# Patient Record
Sex: Male | Born: 1990 | Race: White | Hispanic: No | Marital: Married | Smoking: Never smoker
Health system: Southern US, Community
[De-identification: ages and names within clinical notes are randomized; demographics above are authoritative.]

## PROBLEM LIST (undated history)

## (undated) DIAGNOSIS — I10 Essential (primary) hypertension: Secondary | ICD-10-CM

---
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
## 2010-04-12 ENCOUNTER — Inpatient Hospital Stay (INDEPENDENT_AMBULATORY_CARE_PROVIDER_SITE_OTHER)
Admission: RE | Admit: 2010-04-12 | Discharge: 2010-04-12 | Disposition: A | Discharge status: Home / Self Care | Payer: Self-pay | Source: Ambulatory Visit | Attending: Emergency Medicine | Admitting: Emergency Medicine

## 2010-04-12 DIAGNOSIS — N342 Other urethritis: Secondary | ICD-10-CM

## 2010-04-12 LAB — POCT URINALYSIS DIPSTICK
Bilirubin Urine: NEGATIVE
Ketones, ur: NEGATIVE mg/dL
Nitrite: NEGATIVE
Protein, ur: NEGATIVE mg/dL
pH: 7 (ref 5.0–8.0)

## 2010-04-13 LAB — GC/CHLAMYDIA PROBE AMP, GENITAL: GC Probe Amp, Genital: NEGATIVE

## 2010-04-14 LAB — URINE CULTURE

## 2011-07-05 ENCOUNTER — Emergency Department (HOSPITAL_COMMUNITY): Payer: 59

## 2011-07-05 ENCOUNTER — Ambulatory Visit (HOSPITAL_COMMUNITY)
Admission: EM | Admit: 2011-07-05 | Discharge: 2011-07-06 | Disposition: A | Discharge status: Home / Self Care | Payer: 59 | Attending: General Surgery | Admitting: General Surgery

## 2011-07-05 ENCOUNTER — Encounter (HOSPITAL_COMMUNITY): Payer: Self-pay | Admitting: Family Medicine

## 2011-07-05 DIAGNOSIS — R509 Fever, unspecified: Secondary | ICD-10-CM | POA: Insufficient documentation

## 2011-07-05 DIAGNOSIS — K3580 Unspecified acute appendicitis: Secondary | ICD-10-CM

## 2011-07-05 DIAGNOSIS — D72829 Elevated white blood cell count, unspecified: Secondary | ICD-10-CM | POA: Insufficient documentation

## 2011-07-05 DIAGNOSIS — K37 Unspecified appendicitis: Secondary | ICD-10-CM

## 2011-07-05 LAB — URINALYSIS, ROUTINE W REFLEX MICROSCOPIC
Glucose, UA: NEGATIVE mg/dL
Hgb urine dipstick: NEGATIVE
Leukocytes, UA: NEGATIVE
Protein, ur: NEGATIVE mg/dL
Specific Gravity, Urine: 1.014 (ref 1.005–1.030)
pH: 7 (ref 5.0–8.0)

## 2011-07-05 LAB — CBC
Hemoglobin: 16.7 g/dL (ref 13.0–17.0)
Platelets: 297 10*3/uL (ref 150–400)
RBC: 5.55 MIL/uL (ref 4.22–5.81)
WBC: 17.2 10*3/uL — ABNORMAL HIGH (ref 4.0–10.5)

## 2011-07-05 LAB — DIFFERENTIAL
Basophils Relative: 0 % (ref 0–1)
Eosinophils Relative: 0 % (ref 0–5)
Monocytes Relative: 9 % (ref 3–12)
Neutrophils Relative %: 79 % — ABNORMAL HIGH (ref 43–77)

## 2011-07-05 LAB — BASIC METABOLIC PANEL
CO2: 26 mEq/L (ref 19–32)
Chloride: 99 mEq/L (ref 96–112)
Glucose, Bld: 100 mg/dL — ABNORMAL HIGH (ref 70–99)
Potassium: 3.7 mEq/L (ref 3.5–5.1)
Sodium: 137 mEq/L (ref 135–145)

## 2011-07-05 MED ORDER — SODIUM CHLORIDE 0.9 % IV SOLN
INTRAVENOUS | Status: DC
  Administered 2011-07-05: 22:00:00 via INTRAVENOUS

## 2011-07-05 MED ORDER — MORPHINE SULFATE 4 MG/ML IJ SOLN
4.0000 mg | Freq: Once | INTRAMUSCULAR | Status: AC
  Administered 2011-07-05: 4 mg via INTRAVENOUS
  Filled 2011-07-05: qty 1

## 2011-07-05 MED ORDER — IOHEXOL 300 MG/ML  SOLN
80.0000 mL | Freq: Once | INTRAMUSCULAR | Status: AC | PRN
  Administered 2011-07-05: 80 mL via INTRAVENOUS

## 2011-07-05 MED ORDER — ONDANSETRON HCL 4 MG/2ML IJ SOLN
4.0000 mg | Freq: Once | INTRAMUSCULAR | Status: AC
  Administered 2011-07-05: 4 mg via INTRAVENOUS
  Filled 2011-07-05: qty 2

## 2011-07-05 NOTE — ED Notes (Signed)
MD at bedside. 

## 2011-07-05 NOTE — ED Notes (Signed)
Patient sent [facility] for evaluation of possible appendicitis.  Patient reports abdominal pain since 6pm with vomiting x 1 and RLQ pain.

## 2011-07-05 NOTE — ED Provider Notes (Signed)
History     CSN: [id number]  Arrival date & time 07/05/11  2112   First MD Initiated Contact with Patient 07/05/11 2205      Chief Complaint  Patient presents with  . Abdominal Pain    RLQ    (Consider location/radiation/quality/duration/timing/severity/associated sxs/prior treatment) Patient is a 21 y.o. male presenting with abdominal pain. The history is provided by the patient.  Abdominal Pain The primary symptoms of the illness include abdominal pain, nausea and vomiting. The primary symptoms of the illness do not include fever, shortness of breath, diarrhea or dysuria.  Symptoms associated with the illness do not include chills or hematuria.  23 y male c/o rlq pain since around 6 pm.  N/v once. No fever. No anorexia. No diarrhea. No hematoria. No testicle, penis swelling or discharge. No hx of same. Pain increases with standing.  No hx abd surgery.   History reviewed. No pertinent past medical history.  History reviewed. No pertinent past surgical history.  History reviewed. No pertinent family history.  History  Substance Use Topics  . Smoking status: Never Smoker   . Smokeless tobacco: Not on file  . Alcohol Use: No     occassional      Review of Systems  Constitutional: Negative for fever and chills.  Respiratory: Negative for cough and shortness of breath.   Gastrointestinal: Positive for nausea, vomiting and abdominal pain. Negative for diarrhea.  Genitourinary: Negative for dysuria, hematuria, penile swelling, penile pain and testicular pain.  Neurological: Negative for headaches.  Psychiatric/Behavioral: Negative for confusion.  All other systems reviewed and are negative.    Allergies  Ceclor  Home Medications  No current outpatient prescriptions on file.  BP 140/81  Pulse 103  Temp(Src) 99.1 F (37.3 C) (Oral)  Resp 18  Ht 5\' 7"  (1.702 m)  Wt 137 lb (62.143 kg)  BMI 21.46 kg/m2  SpO2 100%  Physical Exam  Vitals reviewed. Constitutional:  He is oriented to person, place, and time. He appears well-developed and well-nourished. No distress.       Holding hand over rlq  HENT:  Head: Normocephalic and atraumatic.  Eyes: Conjunctivae are normal.  Neck: Normal range of motion. Neck supple.  Cardiovascular: Regular rhythm.   No murmur heard.      tachycardia  Pulmonary/Chest: Effort normal. No respiratory distress.  Abdominal: Soft. He exhibits no distension. There is tenderness. There is rebound. There is no guarding.       rlq ttp  Musculoskeletal: Normal range of motion. He exhibits no edema.  Neurological: He is alert and oriented to person, place, and time.  Skin: Skin is warm and dry.  Psychiatric: He has a normal mood and affect. Thought content normal.    ED Course  Procedures (including critical care time) 74 y male with rlq pain and ttp.  Suspect appy.  Will get labs and ct.   Labs Reviewed  CBC - Abnormal; Notable for the following:    WBC 17.2 (*)    All other components within normal limits  DIFFERENTIAL - Abnormal; Notable for the following:    Neutrophils Relative 79 (*)    Neutro Abs 13.6 (*)    Monocytes Absolute 1.5 (*)    All other components within normal limits  BASIC METABOLIC PANEL - Abnormal; Notable for the following:    Glucose, Bld 100 (*)    All other components within normal limits  URINALYSIS, ROUTINE W REFLEX MICROSCOPIC   No results found.   No diagnosis  found.    MDM  rlq pain Appendicitis          Cheri Guppy, MD 07/08/11 1110

## 2011-07-06 ENCOUNTER — Encounter (HOSPITAL_COMMUNITY): Payer: Self-pay | Admitting: Anesthesiology

## 2011-07-06 ENCOUNTER — Encounter (HOSPITAL_COMMUNITY): Payer: Self-pay | Admitting: General Surgery

## 2011-07-06 ENCOUNTER — Encounter (HOSPITAL_COMMUNITY)
Admission: EM | Disposition: A | Discharge status: Home / Self Care | Payer: Self-pay | Source: Home / Self Care | Attending: Emergency Medicine

## 2011-07-06 ENCOUNTER — Emergency Department (HOSPITAL_COMMUNITY): Payer: 59 | Admitting: Anesthesiology

## 2011-07-06 DIAGNOSIS — K3580 Unspecified acute appendicitis: Secondary | ICD-10-CM

## 2011-07-06 HISTORY — PX: LAPAROSCOPIC APPENDECTOMY: SHX408

## 2011-07-06 SURGERY — APPENDECTOMY, LAPAROSCOPIC
Anesthesia: General | Site: Abdomen | Wound class: Contaminated

## 2011-07-06 MED ORDER — CIPROFLOXACIN IN D5W 400 MG/200ML IV SOLN
400.0000 mg | Freq: Once | INTRAVENOUS | Status: AC
  Administered 2011-07-06: 400 mg via INTRAVENOUS
  Filled 2011-07-06: qty 200

## 2011-07-06 MED ORDER — LACTATED RINGERS IV SOLN: INTRAVENOUS | Status: DC

## 2011-07-06 MED ORDER — PANTOPRAZOLE SODIUM 40 MG IV SOLR
40.0000 mg | Freq: Every day | INTRAVENOUS | Status: DC
  Filled 2011-07-06: qty 40

## 2011-07-06 MED ORDER — ACETAMINOPHEN 325 MG PO TABS: 650.0000 mg | ORAL_TABLET | ORAL | Status: AC | PRN

## 2011-07-06 MED ORDER — DEXAMETHASONE SODIUM PHOSPHATE 10 MG/ML IJ SOLN
INTRAMUSCULAR | Status: DC | PRN
  Administered 2011-07-06: 10 mg via INTRAVENOUS

## 2011-07-06 MED ORDER — MORPHINE SULFATE 4 MG/ML IJ SOLN: 4.0000 mg | INTRAMUSCULAR | Status: DC | PRN

## 2011-07-06 MED ORDER — FENTANYL CITRATE 0.05 MG/ML IJ SOLN
INTRAMUSCULAR | Status: DC | PRN
  Administered 2011-07-06: 50 ug via INTRAVENOUS
  Administered 2011-07-06: 100 ug via INTRAVENOUS
  Administered 2011-07-06 (×2): 50 ug via INTRAVENOUS

## 2011-07-06 MED ORDER — CIPROFLOXACIN IN D5W 400 MG/200ML IV SOLN
INTRAVENOUS | Status: DC | PRN
  Administered 2011-07-06: 400 mg via INTRAVENOUS

## 2011-07-06 MED ORDER — ACETAMINOPHEN 325 MG PO TABS: 650.0000 mg | ORAL_TABLET | ORAL | Status: DC | PRN

## 2011-07-06 MED ORDER — ACETAMINOPHEN 10 MG/ML IV SOLN
INTRAVENOUS | Status: DC | PRN
  Administered 2011-07-06: 1000 mg via INTRAVENOUS

## 2011-07-06 MED ORDER — FENTANYL CITRATE 0.05 MG/ML IJ SOLN: 25.0000 ug | INTRAMUSCULAR | Status: DC | PRN

## 2011-07-06 MED ORDER — ONDANSETRON HCL 4 MG PO TABS: 4.0000 mg | ORAL_TABLET | Freq: Four times a day (QID) | ORAL | Status: DC | PRN

## 2011-07-06 MED ORDER — MIDAZOLAM HCL 5 MG/5ML IJ SOLN
INTRAMUSCULAR | Status: DC | PRN
  Administered 2011-07-06: 1 mg via INTRAVENOUS

## 2011-07-06 MED ORDER — NEOSTIGMINE METHYLSULFATE 1 MG/ML IJ SOLN
INTRAMUSCULAR | Status: DC | PRN
  Administered 2011-07-06: 5 mg via INTRAVENOUS

## 2011-07-06 MED ORDER — IBUPROFEN 200 MG PO TABS: ORAL_TABLET | ORAL | Status: AC

## 2011-07-06 MED ORDER — HYDROCODONE-ACETAMINOPHEN 5-325 MG PO TABS
1.0000 | ORAL_TABLET | ORAL | Status: DC | PRN
  Administered 2011-07-06 (×3): 2 via ORAL
  Filled 2011-07-06 (×3): qty 2

## 2011-07-06 MED ORDER — PROPOFOL 10 MG/ML IV EMUL
INTRAVENOUS | Status: DC | PRN
  Administered 2011-07-06: 180 mg via INTRAVENOUS

## 2011-07-06 MED ORDER — ONDANSETRON HCL 4 MG/2ML IJ SOLN: 4.0000 mg | Freq: Four times a day (QID) | INTRAMUSCULAR | Status: DC | PRN

## 2011-07-06 MED ORDER — LACTATED RINGERS IV SOLN
INTRAVENOUS | Status: DC | PRN
  Administered 2011-07-06 (×2): via INTRAVENOUS

## 2011-07-06 MED ORDER — KCL IN DEXTROSE-NACL 20-5-0.9 MEQ/L-%-% IV SOLN
INTRAVENOUS | Status: DC
  Administered 2011-07-06: 75 mL via INTRAVENOUS
  Filled 2011-07-06 (×2): qty 1000

## 2011-07-06 MED ORDER — BUPIVACAINE-EPINEPHRINE 0.25% -1:200000 IJ SOLN
INTRAMUSCULAR | Status: DC | PRN
  Administered 2011-07-06: 20 mL

## 2011-07-06 MED ORDER — IBUPROFEN 600 MG PO TABS
600.0000 mg | ORAL_TABLET | Freq: Four times a day (QID) | ORAL | Status: DC | PRN
  Filled 2011-07-06: qty 1

## 2011-07-06 MED ORDER — BUPIVACAINE-EPINEPHRINE PF 0.25-1:200000 % IJ SOLN
INTRAMUSCULAR | Status: AC
  Filled 2011-07-06: qty 30

## 2011-07-06 MED ORDER — HYDROCODONE-ACETAMINOPHEN 5-325 MG PO TABS: 1.0000 | ORAL_TABLET | ORAL | Status: DC | PRN

## 2011-07-06 MED ORDER — SUCCINYLCHOLINE CHLORIDE 20 MG/ML IJ SOLN
INTRAMUSCULAR | Status: DC | PRN
  Administered 2011-07-06: 100 mg via INTRAVENOUS

## 2011-07-06 MED ORDER — ONDANSETRON HCL 4 MG/2ML IJ SOLN
INTRAMUSCULAR | Status: DC | PRN
  Administered 2011-07-06 (×2): 2 mg via INTRAVENOUS

## 2011-07-06 MED ORDER — LACTATED RINGERS IR SOLN
Status: DC | PRN
  Administered 2011-07-06: 500 mL

## 2011-07-06 MED ORDER — MORPHINE SULFATE 2 MG/ML IJ SOLN: 2.0000 mg | INTRAMUSCULAR | Status: DC | PRN

## 2011-07-06 MED ORDER — MORPHINE SULFATE 4 MG/ML IJ SOLN
4.0000 mg | Freq: Once | INTRAMUSCULAR | Status: AC
  Administered 2011-07-06: 4 mg via INTRAVENOUS
  Filled 2011-07-06: qty 1

## 2011-07-06 MED ORDER — GLYCOPYRROLATE 0.2 MG/ML IJ SOLN
INTRAMUSCULAR | Status: DC | PRN
  Administered 2011-07-06: .8 mg via INTRAVENOUS

## 2011-07-06 MED ORDER — CISATRACURIUM BESYLATE 2 MG/ML IV SOLN
INTRAVENOUS | Status: DC | PRN
  Administered 2011-07-06: 5 mg via INTRAVENOUS

## 2011-07-06 MED ORDER — ACETAMINOPHEN 10 MG/ML IV SOLN
INTRAVENOUS | Status: AC
  Filled 2011-07-06: qty 100

## 2011-07-06 MED ORDER — LIDOCAINE HCL (CARDIAC) 20 MG/ML IV SOLN
INTRAVENOUS | Status: DC | PRN
  Administered 2011-07-06: 75 mg via INTRAVENOUS

## 2011-07-06 MED FILL — Cisatracurium Besylate (PF) IV Soln 10 MG/5ML (2 MG/ML): INTRAVENOUS | Qty: 5 | Status: AC

## 2011-07-06 SURGICAL SUPPLY — 42 items
ADH SKN CLS APL DERMABOND .7 (GAUZE/BANDAGES/DRESSINGS) ×1
APPLIER CLIP ROT 10 11.4 M/L (STAPLE)
APR CLP MED LRG 11.4X10 (STAPLE)
BAG SPEC RTRVL LRG 6X4 10 (ENDOMECHANICALS)
CANISTER SUCTION 2500CC (MISCELLANEOUS) ×2 IMPLANT
CLIP APPLIE ROT 10 11.4 M/L (STAPLE) IMPLANT
CLOTH BEACON ORANGE TIMEOUT ST (SAFETY) ×2 IMPLANT
CUTTER FLEX LINEAR 45M (STAPLE) ×1 IMPLANT
DECANTER SPIKE VIAL GLASS SM (MISCELLANEOUS) ×1 IMPLANT
DERMABOND ADVANCED (GAUZE/BANDAGES/DRESSINGS) ×1
DERMABOND ADVANCED .7 DNX12 (GAUZE/BANDAGES/DRESSINGS) ×1 IMPLANT
DRAPE LAPAROSCOPIC ABDOMINAL (DRAPES) ×2 IMPLANT
DRAPE UTILITY 15X26 (DRAPE) ×1 IMPLANT
ELECT REM PT RETURN 9FT ADLT (ELECTROSURGICAL) ×2
ELECTRODE REM PT RTRN 9FT ADLT (ELECTROSURGICAL) ×1 IMPLANT
ENDOLOOP SUT PDS II  0 18 (SUTURE)
ENDOLOOP SUT PDS II 0 18 (SUTURE) IMPLANT
GLOVE BIO SURGEON STRL SZ7.5 (GLOVE) ×4 IMPLANT
GOWN PREVENTION PLUS XLARGE (GOWN DISPOSABLE) ×2 IMPLANT
GOWN STRL NON-REIN LRG LVL3 (GOWN DISPOSABLE) ×2 IMPLANT
GOWN STRL REIN XL XLG (GOWN DISPOSABLE) ×2 IMPLANT
HAND ACTIVATED (MISCELLANEOUS) ×1 IMPLANT
IV LACTATED RINGERS 1000ML (IV SOLUTION) ×2 IMPLANT
KIT BASIN OR (CUSTOM PROCEDURE TRAY) ×2 IMPLANT
NS IRRIG 1000ML POUR BTL (IV SOLUTION) ×1 IMPLANT
PENCIL BUTTON HOLSTER BLD 10FT (ELECTRODE) ×1 IMPLANT
POUCH SPECIMEN RETRIEVAL 10MM (ENDOMECHANICALS) IMPLANT
RELOAD 45 VASCULAR/THIN (ENDOMECHANICALS) IMPLANT
RELOAD STAPLE 45 2.5 WHT GRN (ENDOMECHANICALS) IMPLANT
RELOAD STAPLE 45 3.5 BLU ETS (ENDOMECHANICALS) IMPLANT
RELOAD STAPLE TA45 3.5 REG BLU (ENDOMECHANICALS) ×2 IMPLANT
SET IRRIG TUBING LAPAROSCOPIC (IRRIGATION / IRRIGATOR) ×2 IMPLANT
SOLUTION ANTI FOG 6CC (MISCELLANEOUS) ×2 IMPLANT
SUT MNCRL AB 4-0 PS2 18 (SUTURE) ×2 IMPLANT
TOWEL OR 17X26 10 PK STRL BLUE (TOWEL DISPOSABLE) ×2 IMPLANT
TRAY FOLEY CATH 14FRSI W/METER (CATHETERS) ×2 IMPLANT
TRAY LAP CHOLE (CUSTOM PROCEDURE TRAY) ×2 IMPLANT
TROCAR BLADELESS OPT 5 75 (ENDOMECHANICALS) ×3 IMPLANT
TROCAR XCEL 12X100 BLDLESS (ENDOMECHANICALS) ×2 IMPLANT
TROCAR XCEL BLUNT TIP 100MML (ENDOMECHANICALS) ×2 IMPLANT
TROCAR XCEL NON-BLD 11X100MML (ENDOMECHANICALS) ×2 IMPLANT
TUBING INSUFFLATION 10FT LAP (TUBING) ×2 IMPLANT

## 2011-07-06 NOTE — Discharge Summary (Signed)
Physician Discharge Summary  Patient ID: Andrew Sellers MRN: [id number] DOB/AGE: 1990-07-03 21 y.o.  Admit date: 07/05/2011 Discharge date: 07/06/2011  Admission Diagnoses: Acute Appendictis  Discharge Diagnoses: Same Active Problems:  Appendicitis, acute   PROCEDURES: Lapaorscopic Appendectomy 07/06/11 DR. [facility] Course: 21 yo wm presents with rlq pain that started about 6pm this evening. Feels like a cramp that won't go away. He has felt a little fevered. Some nausea. CT shows probable early appendicitis He was taken to the OR and finished early AM.  He is doing well and we plan to discharge home after lunch Follow up 2 weeks DR. Toth Condition on D/C:  Improved  Disposition: Final discharge disposition not confirmed   Medication List  As of 07/06/2011  9:26 AM   TAKE these medications         acetaminophen 325 MG tablet   Commonly known as: TYLENOL   Take 2 tablets (650 mg total) by mouth every 4 (four) hours as needed.      HYDROcodone-acetaminophen 5-325 MG per tablet   Commonly known as: NORCO   Take 1-2 tablets by mouth every 4 (four) hours as needed.      ibuprofen 200 MG tablet   Commonly known as: ADVIL,MOTRIN   2-3 tablets every 6 hours for pain as needed.  You can also use plain tylenol.  Save narcotic pain med for pain not relieved by tylenol or ibuprofen.           Follow-up Information    Follow up with Robyne Askew, MD. Schedule an appointment as soon as possible for a visit in 2 weeks.   Contact information:   [employer] [address] [place] [address] [phone]          Signed: Sherrie George 07/06/2011, 9:26 AM

## 2011-07-06 NOTE — Anesthesia Postprocedure Evaluation (Signed)
  Anesthesia Post-op Note  Patient: Andrew Sellers  Procedure(s) Performed: Procedure(s) (LRB): APPENDECTOMY LAPAROSCOPIC (N/A)  Patient Location: PACU  Anesthesia Type: General  Level of Consciousness: awake, alert , oriented and patient cooperative  Airway and Oxygen Therapy: Patient Spontanous Breathing and Patient connected to nasal cannula oxygen  Post-op Pain: none  Post-op Assessment: Post-op Vital signs reviewed, Patient's Cardiovascular Status Stable, Respiratory Function Stable, Patent Airway, No signs of Nausea or vomiting and Pain level controlled  Post-op Vital Signs: Reviewed and stable  Complications: No apparent anesthesia complications

## 2011-07-06 NOTE — Anesthesia Procedure Notes (Signed)
Procedure Name: Intubation Date/Time: 07/06/2011 3:00 AM Performed by: Edison Pace Pre-anesthesia Checklist: Patient identified, Timeout performed, Emergency Drugs available, Suction available and Patient being monitored Patient Re-evaluated:Patient Re-evaluated prior to inductionOxygen Delivery Method: Circle system utilized Preoxygenation: Pre-oxygenation with 100% oxygen Intubation Type: IV induction, Cricoid Pressure applied and Rapid sequence Laryngoscope Size: Mac and 4 Grade View: Grade I Tube type: Oral Tube size: 7.5 mm Number of attempts: 1 Airway Equipment and Method: Stylet Placement Confirmation: ETT inserted through vocal cords under direct vision,  positive ETCO2 and breath sounds checked- equal and bilateral Secured at: 21 cm Tube secured with: Tape Dental Injury: Teeth and Oropharynx as per pre-operative assessment

## 2011-07-06 NOTE — H&P (Signed)
Andrew Sellers is an 21 y.o. male.   Chief Complaint: abd pain HPI: 21 yo wm presents with rlq pain that started about 6pm this evening. Feels like a cramp that won't go away. He has felt a little fevered. Some nausea. CT shows probable early appendicitis  History reviewed. No pertinent past medical history.  History reviewed. No pertinent past surgical history.  History reviewed. No pertinent family history. Social History:  reports that he has never smoked. He does not have any smokeless tobacco history on file. He reports that he does not drink alcohol or use illicit drugs.  Allergies:  Allergies  Allergen Reactions  . Ceclor (Cefaclor) Anaphylaxis     (Not in a hospital admission)  Results for orders placed during the hospital encounter of 07/05/11 (from the past 48 hour(s))  CBC     Status: Abnormal   Collection Time   07/05/11 10:15 PM      Component Value Range Comment   WBC 17.2 (*) 4.0 - 10.5 (K/uL)    RBC 5.55  4.22 - 5.81 (MIL/uL)    Hemoglobin 16.7  13.0 - 17.0 (g/dL)    HCT 16.1  09.6 - 04.5 (%)    MCV 83.8  78.0 - 100.0 (fL)    MCH 30.1  26.0 - 34.0 (pg)    MCHC 35.9  30.0 - 36.0 (g/dL)    RDW 40.9  81.1 - 91.4 (%)    Platelets 297  150 - 400 (K/uL)   DIFFERENTIAL     Status: Abnormal   Collection Time   07/05/11 10:15 PM      Component Value Range Comment   Neutrophils Relative 79 (*) 43 - 77 (%)    Lymphocytes Relative 12  12 - 46 (%)    Monocytes Relative 9  3 - 12 (%)    Eosinophils Relative 0  0 - 5 (%)    Basophils Relative 0  0 - 1 (%)    Neutro Abs 13.6 (*) 1.7 - 7.7 (K/uL)    Lymphs Abs 2.1  0.7 - 4.0 (K/uL)    Monocytes Absolute 1.5 (*) 0.1 - 1.0 (K/uL)    Eosinophils Absolute 0.0  0.0 - 0.7 (K/uL)    Basophils Absolute 0.0  0.0 - 0.1 (K/uL)    WBC Morphology TOXIC GRANULATION     BASIC METABOLIC PANEL     Status: Abnormal   Collection Time   07/05/11 10:15 PM      Component Value Range Comment   Sodium 137  135 - 145 (mEq/L)    Potassium 3.7  3.5  - 5.1 (mEq/L)    Chloride 99  96 - 112 (mEq/L)    CO2 26  19 - 32 (mEq/L)    Glucose, Bld 100 (*) 70 - 99 (mg/dL)    BUN 12  6 - 23 (mg/dL)    Creatinine, Ser 7.82  0.50 - 1.35 (mg/dL)    Calcium 9.5  8.4 - 10.5 (mg/dL)    GFR calc non Af Amer >90  >90 (mL/min)    GFR calc Af Amer >90  >90 (mL/min)   URINALYSIS, ROUTINE W REFLEX MICROSCOPIC     Status: Normal   Collection Time   07/05/11 10:42 PM      Component Value Range Comment   Color, Urine YELLOW  YELLOW     APPearance CLEAR  CLEAR     Specific Gravity, Urine 1.014  1.005 - 1.030     pH 7.0  5.0 -  8.0     Glucose, UA NEGATIVE  NEGATIVE (mg/dL)    Hgb urine dipstick NEGATIVE  NEGATIVE     Bilirubin Urine NEGATIVE  NEGATIVE     Ketones, ur NEGATIVE  NEGATIVE (mg/dL)    Protein, ur NEGATIVE  NEGATIVE (mg/dL)    Urobilinogen, UA 0.2  0.0 - 1.0 (mg/dL)    Nitrite NEGATIVE  NEGATIVE     Leukocytes, UA NEGATIVE  NEGATIVE  MICROSCOPIC NOT DONE ON URINES WITH NEGATIVE PROTEIN, BLOOD, LEUKOCYTES, NITRITE, OR GLUCOSE <1000 mg/dL.   Ct Abdomen Pelvis W Contrast  07/06/2011  *RADIOLOGY REPORT*  Clinical Data: Right lower quadrant abdominal pain and vomiting.  CT ABDOMEN AND PELVIS WITH CONTRAST  Technique:  Multidetector CT imaging of the abdomen and pelvis was performed following the standard protocol during bolus administration of intravenous contrast.  Contrast: 80mL OMNIPAQUE IOHEXOL 300 MG/ML  SOLN  Comparison: None.  Findings: The visualized lung bases are clear.  The liver and spleen are unremarkable in appearance.  The gallbladder is within normal limits.  The pancreas and adrenal glands are unremarkable.  The kidneys are unremarkable in appearance.  There is no evidence of hydronephrosis.  No renal or ureteral stones are seen.  No perinephric stranding is appreciated.  The small bowel is unremarkable in appearance.  The stomach is within normal limits.  No acute vascular abnormalities are seen.  The appendix is dilated up to 9 mm in  maximal diameter on coronal images, with mild wall thickening and enhancement, and an appendicolith noted along the distal appendix.  Much of the appendix contains fluid, though there are scattered foci of air in the appendix.  There is suggestion of minimal associated soft tissue stranding.  The appearance most likely reflects mild appendicitis, particularly given the patient's symptoms and leukocytosis.  Trace Hunsucker fluid is noted within the pelvis.  There is no evidence for perforation or abscess formation at this time.  The bladder is mildly distended and grossly unremarkable appearance.  The prostate remains normal in size.  No inguinal lymphadenopathy is seen.  No acute osseous abnormalities are identified.  IMPRESSION: Suspect mild acute appendicitis, with dilatation of the appendix to 9 mm in maximal diameter, and mild wall thickening enhancement. Appendicolith noted along the distal appendix.  Trace Canaday fluid within the pelvis.  No evidence for perforation or abscess formation at this time.  These results were called by telephone on 07/05/2011  at  12:38 a.m. to  Dr. Maryanna Shape, who verbally acknowledged these results.  Original Report Authenticated By: Tonia Ghent, M.D.    Review of Systems  Constitutional: Positive for fever.  HENT: Negative.   Eyes: Negative.   Respiratory: Negative.   Cardiovascular: Negative.   Gastrointestinal: Positive for abdominal pain.  Genitourinary: Negative.   Musculoskeletal: Negative.   Skin: Negative.   Neurological: Negative.   Endo/Heme/Allergies: Negative.   Psychiatric/Behavioral: Negative.     Blood pressure 140/81, pulse 103, temperature 99.1 F (37.3 C), temperature source Oral, resp. rate 18, height 5\' 7"  (1.702 m), weight 137 lb (62.143 kg), SpO2 100.00%. Physical Exam  Constitutional: He is oriented to person, place, and time. He appears well-developed and well-nourished.  HENT:  Head: Normocephalic and atraumatic.  Eyes: Conjunctivae  and EOM are normal. Pupils are equal, round, and reactive to light.  Neck: Normal range of motion. Neck supple.  Cardiovascular: Normal rate, regular rhythm and normal heart sounds.   Respiratory: Effort normal and breath sounds normal.  GI: Soft. Bowel sounds are  normal.       Focal rlq pain. No guarding or peritonitis  Musculoskeletal: Normal range of motion.  Neurological: He is alert and oriented to person, place, and time.  Skin: Skin is warm and dry.  Psychiatric: He has a normal mood and affect. His behavior is normal.     Assessment/Plan Probable appendicitis. Because of the risk of perforation and sepsis i think he would benefit from having appendix removed. I have discussed with him the risks and benefits of surgery including some of the technical aspects including the risk of leak and he understands and wishes to proceed.  TOTH III,Dagen Beevers S 07/06/2011, 1:54 AM

## 2011-07-06 NOTE — Op Note (Signed)
07/05/2011 - 07/06/2011  3:48 AM  PATIENT:  Andrew Sellers  21 y.o. male  PRE-OPERATIVE DIAGNOSIS:  acute appendicitis  POST-OPERATIVE DIAGNOSIS:  acute appendicitis  PROCEDURE:  Procedure(s) (LRB): APPENDECTOMY LAPAROSCOPIC (N/A)  SURGEON:  Surgeon(s) and Role:    * Robyne Askew, MD - Primary  PHYSICIAN ASSISTANT:   ASSISTANTS: none   ANESTHESIA:   general  EBL:  Total I/O In: -  Out: 75 [Urine:75]  BLOOD ADMINISTERED:none  DRAINS: none   LOCAL MEDICATIONS USED:  MARCAINE     SPECIMEN:  Source of Specimen:  appendix  DISPOSITION OF SPECIMEN:  PATHOLOGY  COUNTS:  YES  TOURNIQUET:  * No tourniquets in log *  DICTATION: .Dragon Dictation After informed consent was obtained the patient was brought to the operating room and placed in the supine position on the operating room table. After adequate induction of general anesthesia the patient's abdomen was prepped with ChloraPrep, allowed to dry, and draped in the usual sterile manner. The area below the umbilicus was infiltrated with quarter percent Marcaine. A small incision was made with a 15 blade knife. This incision was carried through the subcutaneous tissue bluntly with a hemostat and [employer] retractors until the linea alba was identified. The linea alba was incised with a 15 blade knife. Each side was grasped with Coker clamps and elevated anteriorly. The preperitoneal space was then probed bluntly with a hemostat until the peritoneum was opened and access was gained to the abdominal cavity. A 0 Vicryl purse string stitch was placed in the fascia surrounding the opening. A Hassan cannula was then placed through the opening and anchored in place with the previously placed Vicryl pursestring stitch. The abdomen was then insufflated with carbon dioxide without difficulty. The patient was placed in Trendelenburg position and rotated with the right side up. Next the suprapubic area was infiltrated with quarter percent Marcaine.  A small stab incision was made with 15 blade knife. A 5 mm port was placed bluntly through this incision into the abdominal cavity under direct vision. A site was then chosen between the 2 ports for placement of another 5 mm port. This area was infiltrated with quarter percent Marcaine. A small stab incision was made with a 15 blade knife. A 5mm port was then placed bluntly through this incision into the abdominal cavity under direct vision. A 5 mm 30 scope was then placed through the suprapubic port. The abdomen was inspected and an enlarged inflamed appendix was identified in the right lower quadrant. A Glassman grasper was then used to elevate the appendix. The mesoappendix was then taken down sharply with the harmonic scalpel. Once the mesoappendix was taken down the base of the appendix where it joined the cecum was identified and cleared of any tissue. Next a laparoscopic blue load 6 row 45 mm stapler was placed at the aside cannula and across the base of the appendix where it joined the cecum, clamped, and fired thereby dividing the base of the appendix between staple lines. A laparoscopic bag was inserted through the [facility] cannula. The appendix was placed within the bag and the bag was sealed. The staple line was inspected and found to be completely hemostatic. The abdomen was irrigated with copious amounts of saline. The appendix and bag were then removed with the [facility] cannula through the infraumbilical port. The fascial defect was closed with the previously placed Vicryl pursestring stitch as well as with another figure-of-eight 0 Vicryl stitch. The gas was allowed to escape  and the 5 mm ports were removed. The port sites were irrigated with copious amounts of saline. The skin incisions were closed with interrupted 4 Marko subcuticular stitches. Dermabond dressings were applied. The patient tolerated the procedure well. At the end of the case all needle sponge and instrument counts were correct. The  patient was then awakened and taken to recovery in stable condition.  PLAN OF CARE: Admit for overnight observation  PATIENT DISPOSITION:  PACU - hemodynamically stable.   Delay start of Pharmacological VTE agent (>24hrs) due to surgical blood loss or risk of bleeding: not applicable

## 2011-07-06 NOTE — Transfer of Care (Signed)
Immediate Anesthesia Transfer of Care Note  Patient: Andrew Sellers  Procedure(s) Performed: Procedure(s) (LRB): APPENDECTOMY LAPAROSCOPIC (N/A)  Patient Location: PACU  Anesthesia Type: General  Level of Consciousness: awake, alert , oriented and patient cooperative  Airway & Oxygen Therapy: Patient Spontanous Breathing and Patient connected to face mask oxygen  Post-op Assessment: Report given to PACU RN, Post -op Vital signs reviewed and stable and Patient moving all extremities  Post vital signs: Reviewed and stable  Complications: No apparent anesthesia complications

## 2011-07-06 NOTE — ED Notes (Signed)
MD at bedside. 

## 2011-07-06 NOTE — Anesthesia Postprocedure Evaluation (Signed)
  Anesthesia Post-op Note  Patient: Andrew Sellers  Procedure(s) Performed: Procedure(s) (LRB): APPENDECTOMY LAPAROSCOPIC (N/A)  Patient Location: PACU  Anesthesia Type: General  Level of Consciousness: awake, alert , oriented and patient cooperative  Airway and Oxygen Therapy: Patient Spontanous Breathing and Patient connected to nasal cannula oxygen  Post-op Pain: none  Post-op Assessment: Post-op Vital signs reviewed, Patient's Cardiovascular Status Stable, Respiratory Function Stable, Patent Airway, No signs of Nausea or vomiting and Pain level controlled  Post-op Vital Signs: Reviewed and stable  Complications: No apparent anesthesia complications 

## 2011-07-06 NOTE — Progress Notes (Signed)
Day of Surgery  Subjective: TM99.1,  VSS, He's eating regular diet, he has walked some.  Hesitant to leave right now.  Still having some pain at trochar site.  Objective: Vital signs in last 24 hours: Temp:  [98 F (36.7 C)-99.1 F (37.3 C)] 98 F (36.7 C) (05/10 0651) Pulse Rate:  [92-115] 92  (05/10 0651) Resp:  [10-18] 18  (05/10 0651) BP: (120-149)/(71-85) 127/80 mmHg (05/10 0651) SpO2:  [98 %-100 %] 98 % (05/10 0651) Weight:  [62.143 kg (137 lb)] 62.143 kg (137 lb) (05/09 2143) Last BM Date: 07/04/11    Intake/Output from previous day: 05/09 0701 - 05/10 0700 In: 1700 [I.V.:1700] Out: 475 [Urine:475] Intake/Output this shift:    General appearance: no distress GI: soft, tender, +BS no distension  Lab Results:   Basename 07/05/11 2215  WBC 17.2*  HGB 16.7  HCT 46.5  PLT 297    BMET  Basename 07/05/11 2215  NA 137  K 3.7  CL 99  CO2 26  GLUCOSE 100*  BUN 12  CREATININE 1.09  CALCIUM 9.5   PT/INR No results found for this basename: LABPROT:2,INR:2 in the last 72 hours  No results found for this basename: AST:5,ALT:5,ALKPHOS:5,BILITOT:5,PROT:5,ALBUMIN:5 in the last 168 hours   Lipase  No results found for this basename: lipase     Studies/Results: Ct Abdomen Pelvis W Contrast  07/06/2011  *RADIOLOGY REPORT*  Clinical Data: Right lower quadrant abdominal pain and vomiting.  CT ABDOMEN AND PELVIS WITH CONTRAST  Technique:  Multidetector CT imaging of the abdomen and pelvis was performed following the standard protocol during bolus administration of intravenous contrast.  Contrast: 80mL OMNIPAQUE IOHEXOL 300 MG/ML  SOLN  Comparison: None.  Findings: The visualized lung bases are clear.  The liver and spleen are unremarkable in appearance.  The gallbladder is within normal limits.  The pancreas and adrenal glands are unremarkable.  The kidneys are unremarkable in appearance.  There is no evidence of hydronephrosis.  No renal or ureteral stones are seen.  No  perinephric stranding is appreciated.  The small bowel is unremarkable in appearance.  The stomach is within normal limits.  No acute vascular abnormalities are seen.  The appendix is dilated up to 9 mm in maximal diameter on coronal images, with mild wall thickening and enhancement, and an appendicolith noted along the distal appendix.  Much of the appendix contains fluid, though there are scattered foci of air in the appendix.  There is suggestion of minimal associated soft tissue stranding.  The appearance most likely reflects mild appendicitis, particularly given the patient's symptoms and leukocytosis.  Trace Czaja fluid is noted within the pelvis.  There is no evidence for perforation or abscess formation at this time.  The bladder is mildly distended and grossly unremarkable appearance.  The prostate remains normal in size.  No inguinal lymphadenopathy is seen.  No acute osseous abnormalities are identified.  IMPRESSION: Suspect mild acute appendicitis, with dilatation of the appendix to 9 mm in maximal diameter, and mild wall thickening enhancement. Appendicolith noted along the distal appendix.  Trace Creech fluid within the pelvis.  No evidence for perforation or abscess formation at this time.  These results were called by telephone on 07/05/2011  at  12:38 a.m. to  Dr. Maryanna Shape, who verbally acknowledged these results.  Original Report Authenticated By: Tonia Ghent, M.D.    Medications:    . ciprofloxacin  400 mg Intravenous Once  .  morphine injection  4 mg Intravenous Once  .  morphine injection  4 mg Intravenous Once  . ondansetron  4 mg Intravenous Once  . pantoprazole (PROTONIX) IV  40 mg Intravenous QHS    Assessment/Plan 1. acute appendicitis   Plan; Home later today.        LOS: 1 day    Andrew Sellers 07/06/2011

## 2011-07-06 NOTE — Anesthesia Preprocedure Evaluation (Addendum)
Anesthesia Evaluation  Patient identified by MRN, date of birth, ID band Patient awake    Reviewed: Allergy & Precautions, H&P , NPO status , Patient's Chart, lab work & pertinent test results  Airway Mallampati: II TM Distance: >3 FB Neck ROM: full    Dental No notable dental hx. (+) Teeth Intact and Dental Advisory Given   Pulmonary neg pulmonary ROS,  breath sounds clear to auscultation  Pulmonary exam normal       Cardiovascular Exercise Tolerance: Good negative cardio ROS  Rhythm:regular Rate:Normal     Neuro/Psych negative neurological ROS  negative psych ROS   GI/Hepatic negative GI ROS, Neg liver ROS,   Endo/Other  negative endocrine ROS  Renal/GU negative Renal ROS  negative genitourinary   Musculoskeletal   Abdominal   Peds  Hematology negative hematology ROS (+)   Anesthesia Other Findings   Reproductive/Obstetrics negative OB ROS                           Anesthesia Physical Anesthesia Plan  ASA: I and Emergent  Anesthesia Plan: General   Post-op Pain Management:    Induction: Intravenous  Airway Management Planned: Oral ETT  Additional Equipment:   Intra-op Plan:   Post-operative Plan: Extubation in OR  Informed Consent: I have reviewed the patients History and Physical, chart, labs and discussed the procedure including the risks, benefits and alternatives for the proposed anesthesia with the patient or authorized representative who has indicated his/her understanding and acceptance.   Dental Advisory Given  Plan Discussed with: CRNA and Surgeon  Anesthesia Plan Comments:         Anesthesia Quick Evaluation  

## 2011-07-06 NOTE — Preoperative (Signed)
Beta Blockers   Reason not to administer Beta Blockers:Not Applicable 

## 2011-07-06 NOTE — ED Provider Notes (Signed)
  Physical Exam  BP 127/80  Pulse 92  Temp(Src) 98 F (36.7 C) (Oral)  Resp 18  Ht 5\' 7"  (1.702 m)  Wt 137 lb (62.143 kg)  BMI 21.46 kg/m2  SpO2 98%  Physical Exam  ED Course  Procedures  MDM Patient has appendicitis by CT. He'll be admitted by surgery   Juliet Rude. Rubin Payor, MD 07/06/11 [phone]

## 2011-07-06 NOTE — Discharge Instructions (Signed)
Laparoscopic Appendectomy Appendectomy is surgery to remove the appendix. Laparoscopic surgery uses several small cuts (incisions) instead of one large incision. Laparoscopic surgery offers a shorter recovery time and less discomfort. LET YOUR CAREGIVER KNOW ABOUT:  Allergies to food or medicine.   Medicines taken, including vitamins, dietary supplements, herbs, eyedrops, over-the-counter medicines, and creams.   Use of steroids (by mouth or creams).   Previous problems with anesthetics or numbing medicines.   History of bleeding problems or blood clots.   Previous surgery.   Other health problems, including diabetes, heart problems, lung problems, and kidney problems.   Possibility of pregnancy, if this applies.  RISKS AND COMPLICATIONS  Infection. A germ starts growing in the wound. This can usually be treated with antibiotics. In some cases, the wound will need to be opened and cleaned.   Bleeding.   Damage to other organs.   Sores (abscesses).   Chronic pain at the incision sites. This is defined as pain that lasts for more than 3 months.   Blood clots in the legs that may rarely travel to the lungs.   Infection in the lungs (pneumonia).  BEFORE THE PROCEDURE Appendectomy is usually performed immediately after an inflamed appendix (appendicitis) is diagnosed. No preparation is necessary ahead of this procedure. PROCEDURE  You will be given medicine that makes you sleep (general anesthetic). After you are asleep, a flexible tube (catheter) may be inserted into your bladder to drain your urine during surgery. The tube is removed before you wake up after surgery. When you are asleep, carbondioxide gas will be used to inflate your abdomen. This will allow your surgeon to see inside your abdomen and perform your surgery. Three small incisions will be made in your abdomen. Your surgeon will insert a thin, lighted tube (laparoscope) through one of the incisions. Your surgeon will  look through the laparoscope while performing the surgery. Other tools will be inserted through the other incisions. Laparoscopic procedures may not be appropriate when:  There is major scarring from a previous surgery.   The patient has bleeding disorders.   A pregnancy is near term.   There are other conditions which make the laparoscopic procedure impossible, such as an advanced infection or a ruptured appendix.  If your surgeon feels it is not safe to continue with the laparoscopic procedure, he or she will perform an open surgery instead. This gives the surgeon a larger view and more space to work. Open surgery requires a longer recovery time. After your appendix is removed, your incisions will be closed with stitches (sutures) or skin adhesive. AFTER THE PROCEDURE You will be taken to a recovery room. When the anesthesia has worn off, you will be returned to your hospital room. You will be given pain medicines to keep you comfortable. Ask your caregiver how long your hospital stay will be. Document Released: 09/27/2003 Document Revised: 02/01/2011 Document Reviewed: 08/22/2010 ExitCare Patient Information 2012 [employer]CCS ______[facility] LAPAROSCOPIC SURGERY: POST OP INSTRUCTIONS Always review your discharge instruction sheet given to you by the facility where your surgery was performed. IF YOU HAVE DISABILITY OR FAMILY LEAVE FORMS, YOU MUST BRING THEM TO THE OFFICE FOR PROCESSING.   DO NOT GIVE THEM TO YOUR DOCTOR.  1. A prescription for pain medication may be given to you upon discharge.  Take your pain medication as prescribed, if needed.  If narcotic pain medicine is not needed, then you may take acetaminophen (Tylenol) or ibuprofen (Advil) as needed. 2. Take your   usually prescribed medications unless otherwise directed. 3. If you need a refill on your pain medication, please contact your pharmacy.  They will contact our office to request authorization.  Prescriptions will not be filled after 5pm or on week-ends. 4. You should follow a light diet the first few days after arrival home, such as soup and crackers, etc.  Be sure to include lots of fluids daily. 5. Most patients will experience some swelling and bruising in the area of the incisions.  Ice packs will help.  Swelling and bruising can take several days to resolve.  6. It is common to experience some constipation if taking pain medication after surgery.  Increasing fluid intake and taking a stool softener (such as Colace) will usually help or prevent this problem from occurring.  A mild laxative (Milk of Magnesia or Miralax) should be taken according to package instructions if there are no bowel movements after 48 hours. 7. Unless discharge instructions indicate otherwise, you may remove your bandages 24-48 hours after surgery, and you may shower at that time.  You may have steri-strips (small skin tapes) in place directly over the incision.  These strips should be left on the skin for 7-10 days.  If your surgeon used skin glue on the incision, you may shower in 24 hours.  The glue will flake off over the next 2-3 weeks.  Any sutures or staples will be removed at the office during your follow-up visit. 8. ACTIVITIES:  You may resume regular (light) daily activities beginning the next day--such as daily self-care, walking, climbing stairs--gradually increasing activities as tolerated.  You may have sexual intercourse when it is comfortable.  Refrain from any heavy lifting or straining until approved by your doctor. a. You may drive when you are no longer taking prescription pain medication, you can comfortably wear a seatbelt, and you can safely maneuver your car and apply brakes. b. RETURN TO WORK:  __________________________________________________________ 9. You should see your doctor in the office for a follow-up appointment approximately 2-3 weeks after your surgery.  Make sure that you call for  this appointment within a day or two after you arrive home to insure a convenient appointment time. 10. OTHER INSTRUCTIONS: __________________________________________________________________________________________________________________________ __________________________________________________________________________________________________________________________ WHEN TO CALL YOUR DOCTOR: 1. Fever over 101.0 2. Inability to urinate 3. Continued bleeding from incision. 4. Increased pain, redness, or drainage from the incision. 5. Increasing abdominal pain  The clinic staff is available to answer your questions during regular business hours.  Please don't hesitate to call and ask to speak to one of the nurses for clinical concerns.  If you have a medical emergency, go to the nearest emergency room or call 911.  A surgeon from [facility] is always on call at the hospital. [address], [address], [place], [place]  [address] ? [address] [address], [place], [place]   [address] [phone] ? [phone] ? FAX [phone] Web site: [email or web address]  

## 2011-07-08 NOTE — Anesthesia Postprocedure Evaluation (Signed)
  Anesthesia Post-op Note  Patient: Andrew Sellers  Procedure(s) Performed: Procedure(s) (LRB): APPENDECTOMY LAPAROSCOPIC (N/A)  Patient Location: PACU  Anesthesia Type: General  Level of Consciousness: awake and alert   Airway and Oxygen Therapy: Patient Spontanous Breathing  Post-op Pain: mild  Post-op Assessment: Post-op Vital signs reviewed, Patient's Cardiovascular Status Stable, Respiratory Function Stable, Patent Airway and No signs of Nausea or vomiting  Post-op Vital Signs: stable  Complications: No apparent anesthesia complications

## 2011-07-09 ENCOUNTER — Encounter (HOSPITAL_COMMUNITY): Payer: Self-pay | Admitting: General Surgery

## 2011-07-12 ENCOUNTER — Encounter (INDEPENDENT_AMBULATORY_CARE_PROVIDER_SITE_OTHER): Payer: Self-pay | Admitting: General Surgery

## 2011-07-12 ENCOUNTER — Ambulatory Visit (INDEPENDENT_AMBULATORY_CARE_PROVIDER_SITE_OTHER): Payer: 59 | Admitting: General Surgery

## 2011-07-12 VITALS — BP 148/90 | HR 84 | Temp 100.4°F | Resp 12 | Ht 66.0 in | Wt 135.0 lb

## 2011-07-12 DIAGNOSIS — K3580 Unspecified acute appendicitis: Secondary | ICD-10-CM

## 2011-07-12 NOTE — Patient Instructions (Signed)
No heavy lifting for another 5 weeks

## 2011-07-13 ENCOUNTER — Encounter (INDEPENDENT_AMBULATORY_CARE_PROVIDER_SITE_OTHER): Payer: Self-pay | Admitting: General Surgery

## 2011-07-13 NOTE — Progress Notes (Signed)
Subjective:     Patient ID: Andrew Sellers, male   DOB: 1990/08/28, 21 y.o.   MRN: [id number]  HPI The pt is a 21 yo wm who is about a week out from a laparoscopic appendectomy for appendicitis. He has done very well since surgery. He has no complaints. His appetite is good and his bowels are working normally.  Review of Systems     Objective:   Physical Exam On exam his abd is soft and nontender. His incisions are healing nicely with no sign of infection.    Assessment:     S/P lap appy    Plan:     At this point he is doing great. I would like him to refrain from strenuous activity for the full 6 weeks from the time of surgery. We will plan on seeing him back on a prn basis

## 2013-05-01 IMAGING — CT CT ABD-PELV W/ CM
1 of 2 series · 15 of 32 positions shown, 19 images · IV contrast (APPLIED)
Comparison: None.

CLINICAL DATA: Right lower quadrant abdominal pain and vomiting.

CT ABDOMEN AND PELVIS WITH CONTRAST
TECHNIQUE: Multidetector CT imaging of the abdomen and pelvis was
performed following the standard protocol during bolus
administration of intravenous contrast.
Contrast: 80mL OMNIPAQUE IOHEXOL 300 MG/ML  SOLN

[Series 2: abd/pel with · axial · 0.74mm/px · z∈[+1086,+1461]mm · 15 of 83 slices shown, 19 images]
[im 4/83  soft-tissue]
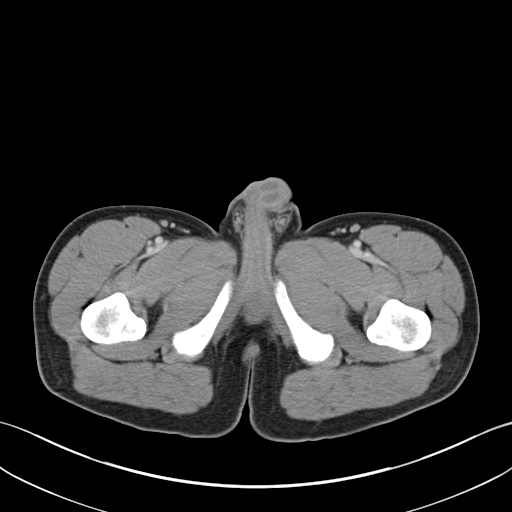
[im 4/83  bone]
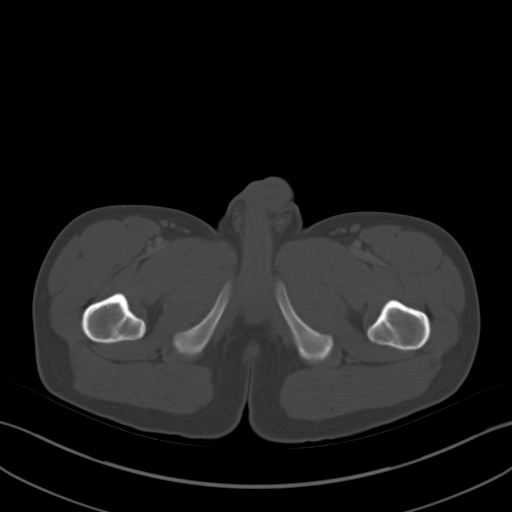
[im 11/83  soft-tissue]
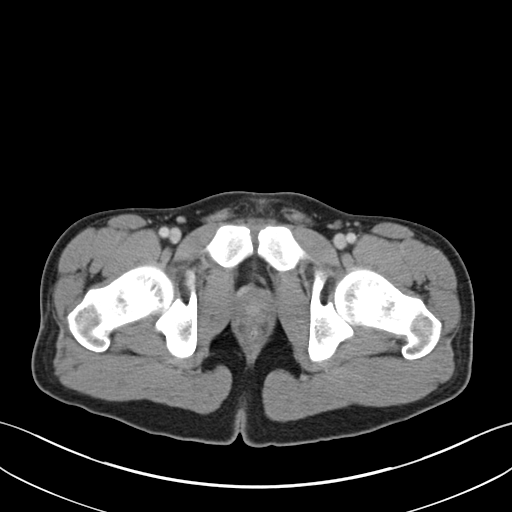
[im 18/83  soft-tissue]
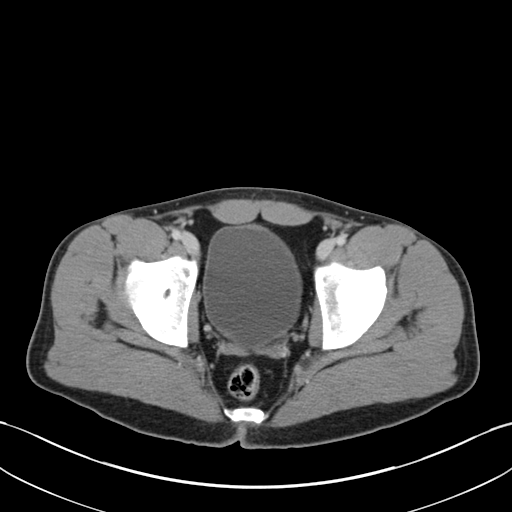
[im 24/83  soft-tissue]
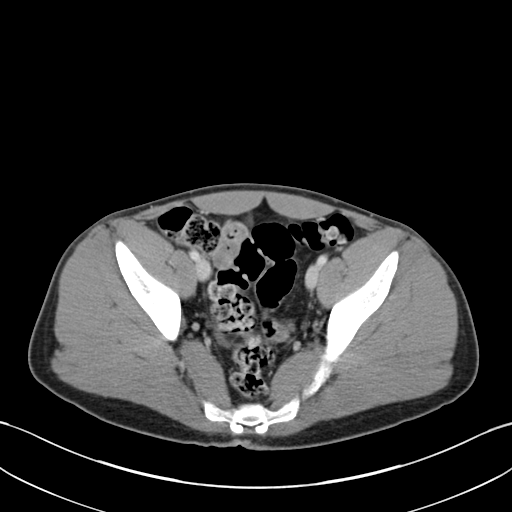
[im 28/83  soft-tissue]
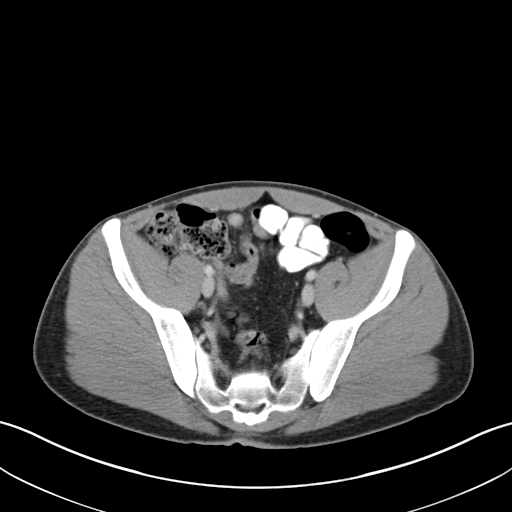
[im 35/83  soft-tissue]
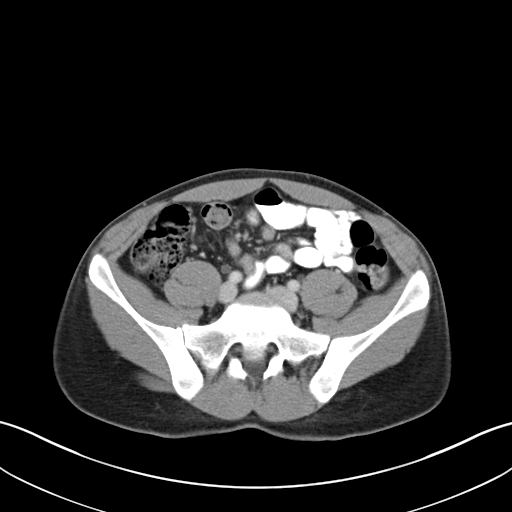
[im 42/83  soft-tissue]
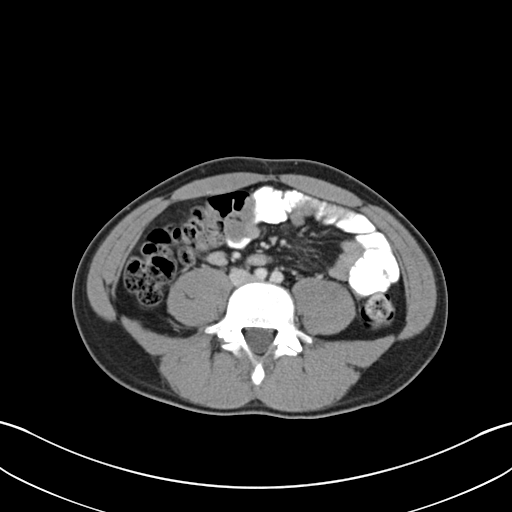
[im 48/83  soft-tissue]
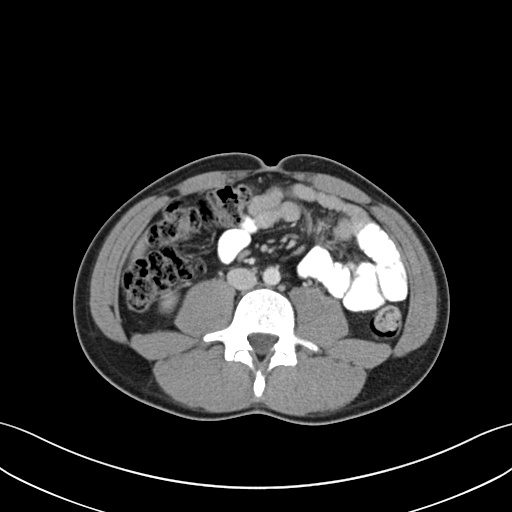
[im 55/83  soft-tissue]
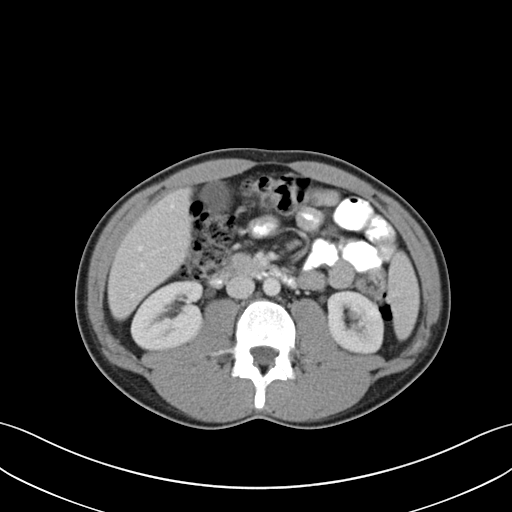
[im 55/83  bone]
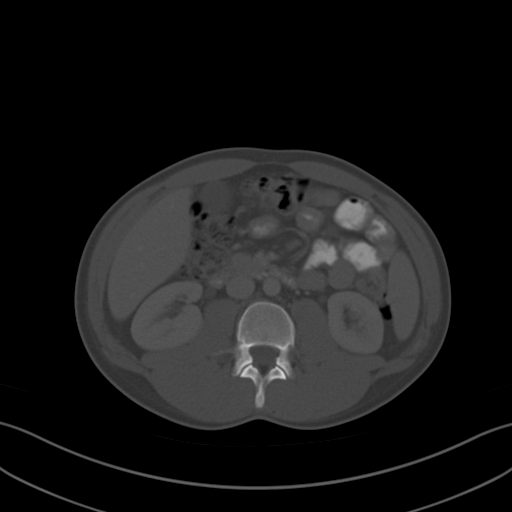
[im 59/83  soft-tissue]
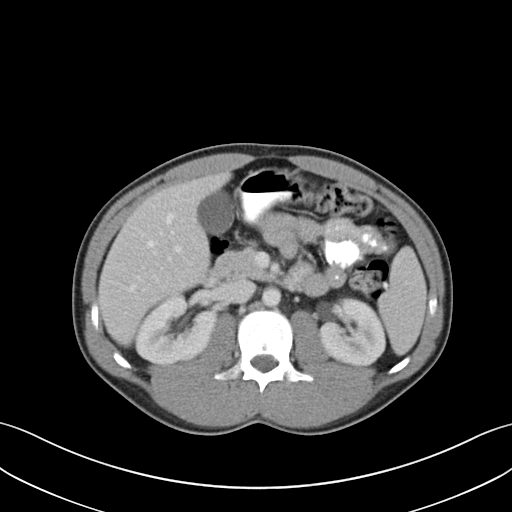
[im 65/83  soft-tissue]
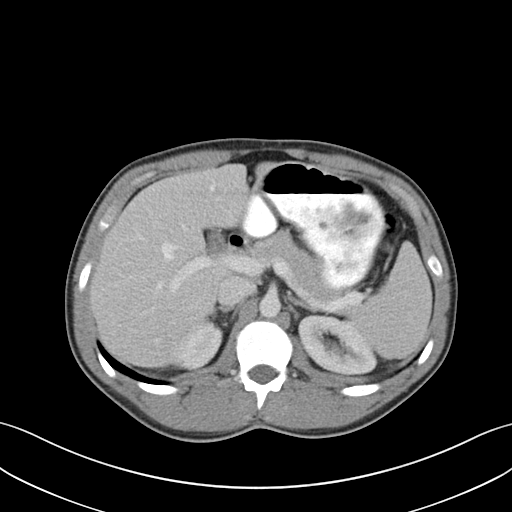
[im 69/83  lung]
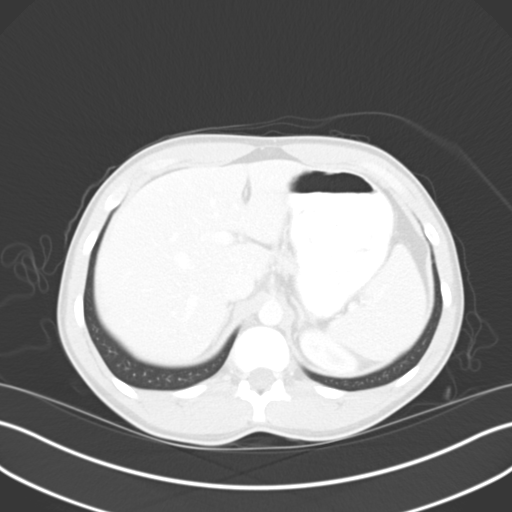
[im 72/83  soft-tissue]
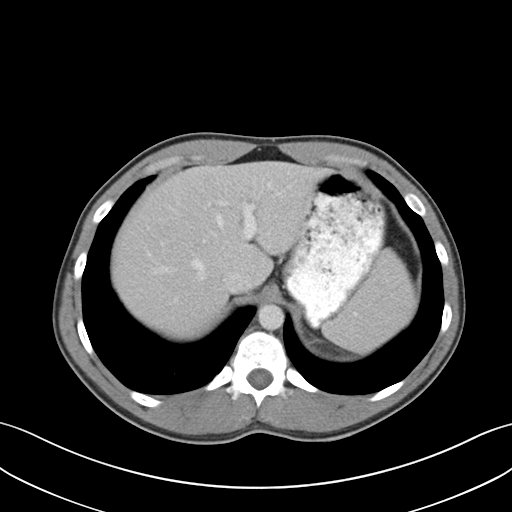
[im 72/83  lung]
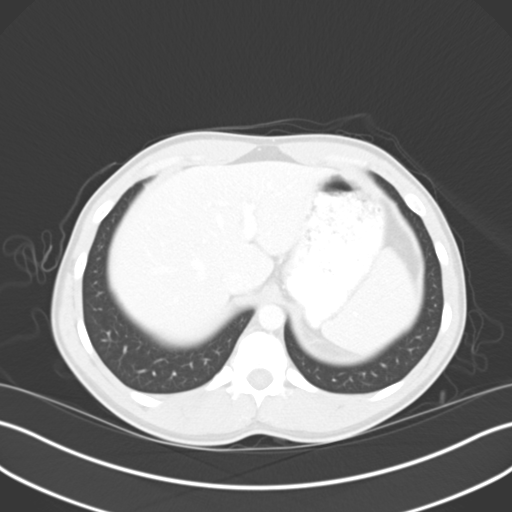
[im 76/83  lung]
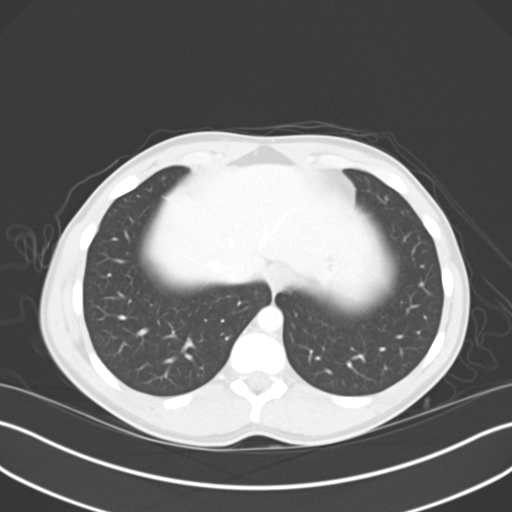
[im 79/83  soft-tissue]
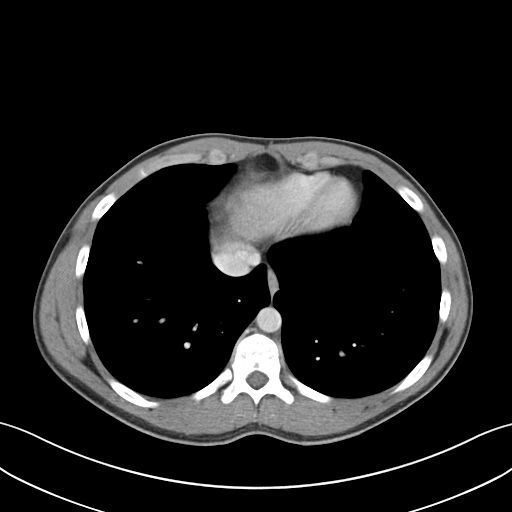
[im 79/83  lung]
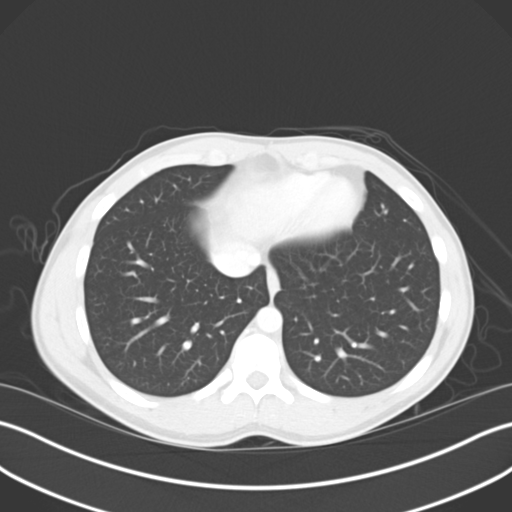

[15 of 32 positions shown; findings below may reference images not displayed]

FINDINGS: The visualized lung bases are clear.

The liver and spleen are unremarkable in appearance.  The
gallbladder is within normal limits.  The pancreas and adrenal
glands are unremarkable.

The kidneys are unremarkable in appearance.  There is no evidence
of hydronephrosis.  No renal or ureteral stones are seen.  No
perinephric stranding is appreciated.

The small bowel is unremarkable in appearance.  The stomach is
within normal limits.  No acute vascular abnormalities are seen.

The appendix is dilated up to 9 mm in maximal diameter on coronal
images, with mild wall thickening and enhancement, and an
appendicolith noted along the distal appendix.  Much of the
appendix contains fluid, though there are scattered foci of air in
the appendix.  There is suggestion of minimal associated soft
tissue stranding.  The appearance most likely reflects mild
appendicitis, particularly given the patient's symptoms and
leukocytosis.

Trace free fluid is noted within the pelvis.  There is no evidence
for perforation or abscess formation at this time.

The bladder is mildly distended and grossly unremarkable
appearance.  The prostate remains normal in size.  No inguinal
lymphadenopathy is seen.

No acute osseous abnormalities are identified.
IMPRESSION: Suspect mild acute appendicitis, with dilatation of the appendix to
9 mm in maximal diameter, and mild wall thickening enhancement.
Appendicolith noted along the distal appendix.  Trace free fluid
within the pelvis.  No evidence for perforation or abscess
formation at this time.

These results were called by telephone on 07/05/2011  at  [DATE]
a.m. to  Dr. She Refs Shafec, who verbally acknowledged these
results.

## 2016-10-27 ENCOUNTER — Emergency Department (HOSPITAL_BASED_OUTPATIENT_CLINIC_OR_DEPARTMENT_OTHER)
Admission: EM | Admit: 2016-10-27 | Discharge: 2016-10-27 | Disposition: A | Discharge status: Home / Self Care | Payer: Managed Care, Other (non HMO) | Attending: Emergency Medicine | Admitting: Emergency Medicine

## 2016-10-27 ENCOUNTER — Encounter (HOSPITAL_BASED_OUTPATIENT_CLINIC_OR_DEPARTMENT_OTHER): Payer: Self-pay | Admitting: Emergency Medicine

## 2016-10-27 DIAGNOSIS — R1030 Lower abdominal pain, unspecified: Secondary | ICD-10-CM | POA: Diagnosis present

## 2016-10-27 DIAGNOSIS — R112 Nausea with vomiting, unspecified: Secondary | ICD-10-CM | POA: Diagnosis not present

## 2016-10-27 DIAGNOSIS — R197 Diarrhea, unspecified: Secondary | ICD-10-CM | POA: Diagnosis not present

## 2016-10-27 LAB — CBC WITH DIFFERENTIAL/PLATELET
BASOS PCT: 0 %
Basophils Absolute: 0 10*3/uL (ref 0.0–0.1)
Eosinophils Absolute: 0 10*3/uL (ref 0.0–0.7)
Eosinophils Relative: 0 %
HEMATOCRIT: 45.1 % (ref 39.0–52.0)
HEMOGLOBIN: 16.3 g/dL (ref 13.0–17.0)
LYMPHS ABS: 0.8 10*3/uL (ref 0.7–4.0)
Lymphocytes Relative: 9 %
MCH: 29.9 pg (ref 26.0–34.0)
MCHC: 36.1 g/dL — AB (ref 30.0–36.0)
MCV: 82.8 fL (ref 78.0–100.0)
MONOS PCT: 21 %
Monocytes Absolute: 1.8 10*3/uL — ABNORMAL HIGH (ref 0.1–1.0)
NEUTROS ABS: 6 10*3/uL (ref 1.7–7.7)
NEUTROS PCT: 70 %
Platelets: 222 10*3/uL (ref 150–400)
RBC: 5.45 MIL/uL (ref 4.22–5.81)
RDW: 12.9 % (ref 11.5–15.5)
WBC: 8.6 10*3/uL (ref 4.0–10.5)

## 2016-10-27 LAB — COMPREHENSIVE METABOLIC PANEL
ALBUMIN: 4.1 g/dL (ref 3.5–5.0)
ALK PHOS: 89 U/L (ref 38–126)
ALT: 38 U/L (ref 17–63)
ANION GAP: 10 (ref 5–15)
AST: 22 U/L (ref 15–41)
BILIRUBIN TOTAL: 0.6 mg/dL (ref 0.3–1.2)
BUN: 7 mg/dL (ref 6–20)
CALCIUM: 9 mg/dL (ref 8.9–10.3)
CO2: 25 mmol/L (ref 22–32)
CREATININE: 1.05 mg/dL (ref 0.61–1.24)
Chloride: 98 mmol/L — ABNORMAL LOW (ref 101–111)
GFR calc Af Amer: >60 mL/min (ref >60)
GFR calc non Af Amer: >60 mL/min (ref >60)
GLUCOSE: 109 mg/dL — AB (ref 65–99)
Potassium: 3.7 mmol/L (ref 3.5–5.1)
Sodium: 133 mmol/L — ABNORMAL LOW (ref 135–145)
TOTAL PROTEIN: 7.7 g/dL (ref 6.5–8.1)

## 2016-10-27 MED ORDER — SODIUM CHLORIDE 0.9 % IV BOLUS (SEPSIS)
1000.0000 mL | Freq: Once | INTRAVENOUS | Status: AC
  Administered 2016-10-27: 1000 mL via INTRAVENOUS

## 2016-10-27 MED ORDER — CIPROFLOXACIN HCL 500 MG PO TABS: 500.0000 mg | ORAL_TABLET | Freq: Two times a day (BID) | ORAL | 0 refills | Status: DC

## 2016-10-27 NOTE — ED Notes (Addendum)
Patient with lower bad cramping since Thursday 0300. Patient states he has intermittent nausea and vomiting, denies presently, with multiple episodes of diarrhea since then. Patient reports stool to be watery with blood in it, states this is decreasing, although he has limited PO intake today (1 poptart, crackers, water, and gatorade). Abd soft, non tender, bowel sounds +x4. Low grade fevers as well, treated with ibuprofen at home. A&Ox4, NAD noted. PIV placed, blood drawn sent to lab.

## 2016-10-27 NOTE — ED Triage Notes (Signed)
Patient states that he has had 3 days of abdominal cramping, with intermittent nausea and vomiting. Reports that he also is having frequent loose stools with some blood noted

## 2016-10-27 NOTE — ED Provider Notes (Signed)
[facility] Note   CSN: [id number] Arrival date & time: 10/27/16  1603     History   Chief Complaint Chief Complaint  Patient presents with  . Abdominal Pain    HPI Andrew Sellers is a 26 y.o. male.  Patient is a healthy 26 year old male with no significant past medical history presenting today with 3 days of excessive diarrhea which she states was 20-30 episodes per day for the last 2 days and about 10 episodes today. Nausea associated with this and one episode of vomiting each day. Patient also had a temperature of 101.6 yesterday but temperature of 99 today. He has been trying to drink fluids but has been able to eat very little. He states the abdominal cramping and pain is the most significant after having diarrhea. Today was the first day he noted some small streaks of bright red blood but none prior to that. Patient states approximately one day prior to symptoms starting he was eating out in the [place] did not taste right. He otherwise has not recently taken antibiotics and has had no travel outside of the [place]. He does not have any known bowel disease. He denies any drug or alcohol use.   The history is provided by the patient.  Abdominal Pain   This is a new problem. Episode onset: 3 days ago. The problem occurs constantly. The problem has not changed since onset.The pain is associated with an unknown factor. Pain location: Generalized lower abdomen. The quality of the pain is aching, cramping and colicky. The pain is at a severity of 9/10 (At its worst after bowel movements it's 9 out of 10 but generally much lower like 2 or 3 appetite). The pain is moderate. Associated symptoms include anorexia, fever, diarrhea, nausea and vomiting. Pertinent negatives include headaches. The symptoms are aggravated by eating. Nothing relieves the symptoms. Past medical history comments: Prior history of IBS as a kid and appendectomy 5 years ago.Marland Kitchen    History reviewed. No  pertinent past medical history.  Patient Active Problem List   Diagnosis Date Noted  . Appendicitis, acute 07/06/2011    Past Surgical History:  Procedure Laterality Date  . LAPAROSCOPIC APPENDECTOMY  07/06/2011   Procedure: APPENDECTOMY LAPAROSCOPIC;  Surgeon: Robyne Askew, MD;  Location: [facility];  Service: General;  Laterality: N/A;       Home Medications    Prior to Admission medications   Medication Sig Start Date End Date Taking? Authorizing Provider  ibuprofen (ADVIL) 200 MG tablet 2-3 tablets every 6 hours for pain as needed.  You can also use plain tylenol.  Save narcotic pain med for pain not relieved by tylenol or ibuprofen. 07/06/11   Sherrie George, PA-C    Family History History reviewed. No pertinent family history.  Social History Social History  Substance Use Topics  . Smoking status: Never Smoker  . Smokeless tobacco: Never Used  . Alcohol use No     Comment: occassional     Allergies   Ceclor [cefaclor]   Review of Systems Review of Systems  Constitutional: Positive for fever.  Gastrointestinal: Positive for abdominal pain, anorexia, diarrhea, nausea and vomiting.  Neurological: Negative for headaches.  All other systems reviewed and are negative.    Physical Exam Updated Vital Signs BP (!) 144/91 (BP Location: Left Arm)   Pulse 100   Temp 98.4 F (36.9 C) (Oral)   Resp 16   Ht 5\' 6"  (1.676 m)   Wt  72.6 kg (160 lb)   SpO2 99%   BMI 25.82 kg/m   Physical Exam  Constitutional: He is oriented to person, place, and time. He appears well-developed and well-nourished. No distress.  HENT:  Head: Normocephalic and atraumatic.  Mouth/Throat: Oropharynx is clear and moist.  Dry mucous membranes  Eyes: Pupils are equal, round, and reactive to light. Conjunctivae and EOM are normal.  Neck: Normal range of motion. Neck supple.  Cardiovascular: Normal rate, regular rhythm and intact distal pulses.   No murmur heard. Pulmonary/Chest:  Effort normal and breath sounds normal. No respiratory distress. He has no wheezes. He has no rales.  Abdominal: Soft. He exhibits no distension. There is no hepatosplenomegaly. There is tenderness in the right lower quadrant, suprapubic area and left lower quadrant. There is no rebound, no guarding and negative Murphy's sign.  Pain across the lower abdomen with palpation  Musculoskeletal: Normal range of motion. He exhibits no edema or tenderness.  Neurological: He is alert and oriented to person, place, and time.  Skin: Skin is warm and dry. No rash noted. No erythema.  Psychiatric: He has a normal mood and affect. His behavior is normal.  Nursing note and vitals reviewed.    ED Treatments / Results  Labs (all labs ordered are listed, but only abnormal results are displayed) Labs Reviewed  CBC WITH DIFFERENTIAL/PLATELET - Abnormal; Notable for the following:       Result Value   MCHC 36.1 (*)    Monocytes Absolute 1.8 (*)    All other components within normal limits  COMPREHENSIVE METABOLIC PANEL - Abnormal; Notable for the following:    Sodium 133 (*)    Chloride 98 (*)    Glucose, Bld 109 (*)    All other components within normal limits    EKG  EKG Interpretation None       Radiology No results found.  Procedures Procedures (including critical care time)  Medications Ordered in ED Medications  sodium chloride 0.9 % bolus 1,000 mL (1,000 mLs Intravenous New Bag/Given 10/27/16 1651)     Initial Impression / Assessment and Plan / ED Course  I have reviewed the triage vital signs and the nursing notes.  Pertinent labs & imaging results that were available during my care of the patient were reviewed by me and considered in my medical decision making (see chart for details).     Patient is a young healthy male presenting today with GI symptoms most concerning for either viral pathology or foodborne illness. No one else he knows is sick but ate bad lettuce at a  restaurant 24 hours before symptoms started. Patient has had a prior appendectomy and no physical exam findings concerning for diverticulitis or obstruction. Low suspicion for GU pathology. CBC, CMP pending. Patient given IV fluids for mild dehydration.  5:46 PM Labs are reassuring patient is feeling better after IV fluids. Patient given a short course of Cipro due to concern for foodborne illness and he will try Imodium and has anti-medics at home.  Final Clinical Impressions(s) / ED Diagnoses   Final diagnoses:  Nausea vomiting and diarrhea    New Prescriptions New Prescriptions   CIPROFLOXACIN (CIPRO) 500 MG TABLET    Take 1 tablet (500 mg total) by mouth 2 (two) times daily.     Gwyneth SproutPlunkett, Charletta Voight, MD 10/27/16 [phone]

## 2016-10-27 NOTE — ED Notes (Signed)
Patient given sprite and crackers for PO challenge

## 2016-10-27 NOTE — ED Notes (Signed)
ED Provider at bedside. 

## 2019-12-14 DIAGNOSIS — I10 Essential (primary) hypertension: Secondary | ICD-10-CM | POA: Diagnosis not present

## 2019-12-14 DIAGNOSIS — Z23 Encounter for immunization: Secondary | ICD-10-CM | POA: Diagnosis not present

## 2020-01-18 DIAGNOSIS — I10 Essential (primary) hypertension: Secondary | ICD-10-CM | POA: Diagnosis not present

## 2020-02-11 DIAGNOSIS — I10 Essential (primary) hypertension: Secondary | ICD-10-CM | POA: Diagnosis not present

## 2020-07-11 DIAGNOSIS — I10 Essential (primary) hypertension: Secondary | ICD-10-CM | POA: Diagnosis not present

## 2020-07-14 DIAGNOSIS — I10 Essential (primary) hypertension: Secondary | ICD-10-CM | POA: Diagnosis not present

## 2020-07-14 DIAGNOSIS — Z0000 Encounter for general adult medical examination without abnormal findings: Secondary | ICD-10-CM | POA: Diagnosis not present

## 2020-07-14 DIAGNOSIS — Z1331 Encounter for screening for depression: Secondary | ICD-10-CM | POA: Diagnosis not present

## 2020-07-14 DIAGNOSIS — Z1389 Encounter for screening for other disorder: Secondary | ICD-10-CM | POA: Diagnosis not present

## 2020-07-20 DIAGNOSIS — U071 COVID-19: Secondary | ICD-10-CM | POA: Diagnosis not present

## 2020-07-20 DIAGNOSIS — J069 Acute upper respiratory infection, unspecified: Secondary | ICD-10-CM | POA: Diagnosis not present

## 2020-07-23 ENCOUNTER — Emergency Department (HOSPITAL_BASED_OUTPATIENT_CLINIC_OR_DEPARTMENT_OTHER)
Admission: EM | Admit: 2020-07-23 | Discharge: 2020-07-23 | Disposition: A | Discharge status: Home / Self Care | Payer: BC Managed Care – PPO | Attending: Emergency Medicine | Admitting: Emergency Medicine

## 2020-07-23 ENCOUNTER — Encounter (HOSPITAL_BASED_OUTPATIENT_CLINIC_OR_DEPARTMENT_OTHER): Payer: Self-pay | Admitting: *Deleted

## 2020-07-23 ENCOUNTER — Other Ambulatory Visit: Payer: Self-pay

## 2020-07-23 DIAGNOSIS — I10 Essential (primary) hypertension: Secondary | ICD-10-CM | POA: Diagnosis not present

## 2020-07-23 DIAGNOSIS — U071 COVID-19: Secondary | ICD-10-CM | POA: Diagnosis not present

## 2020-07-23 DIAGNOSIS — Z79899 Other long term (current) drug therapy: Secondary | ICD-10-CM | POA: Insufficient documentation

## 2020-07-23 HISTORY — DX: Essential (primary) hypertension: I10

## 2020-07-23 NOTE — ED Provider Notes (Signed)
[facility] EMERGENCY DEPT Provider Note   CSN: [id number] Arrival date & time: 07/23/20  1345     History Chief Complaint  Patient presents with  . Hypertension  . Covid +    Andrew Sellers is a 30 y.o. male.  Patient had elevated blood pressure prior to arrival in the 150s.  Tested positive for COVID several days ago.  Blood pressure now normal.  Asymptomatic.  Has had mostly GI symptoms.  Feeling much better today from GI standpoint.  Thinks he might be mildly dehydrated.  The history is provided by the patient.  Hypertension This is a chronic problem. The problem occurs constantly. The problem has not changed since onset.Pertinent negatives include no chest pain, no abdominal pain and no shortness of breath. Nothing aggravates the symptoms. Nothing relieves the symptoms. He has tried nothing for the symptoms. The treatment provided no relief.       Past Medical History:  Diagnosis Date  . Hypertension     Patient Active Problem List   Diagnosis Date Noted  . Appendicitis, acute 07/06/2011    Past Surgical History:  Procedure Laterality Date  . LAPAROSCOPIC APPENDECTOMY  07/06/2011   Procedure: APPENDECTOMY LAPAROSCOPIC;  Surgeon: Robyne Askew, MD;  Location: [facility] ORS;  Service: General;  Laterality: N/A;       No family history on file.  Social History   Tobacco Use  . Smoking status: Never Smoker  . Smokeless tobacco: Never Used  Substance Use Topics  . Alcohol use: Yes    Comment: occassional  . Drug use: No    Home Medications Prior to Admission medications   Medication Sig Start Date End Date Taking? Authorizing Provider  olmesartan (BENICAR) 40 MG tablet Take 20 mg by mouth 2 (two) times daily.   Yes [provider]  ciprofloxacin (CIPRO) 500 MG tablet Take 1 tablet (500 mg total) by mouth 2 (two) times daily. 10/27/16   Gwyneth Sprout, MD  ibuprofen (ADVIL) 200 MG tablet 2-3 tablets every 6 hours for pain as needed.  You  can also use plain tylenol.  Save narcotic pain med for pain not relieved by tylenol or ibuprofen. 07/06/11   Sherrie George, PA-C    Allergies    Ceclor [cefaclor]  Review of Systems   Review of Systems  Constitutional: Negative for chills and fever.  HENT: Negative for ear pain and sore throat.   Eyes: Negative for pain and visual disturbance.  Respiratory: Negative for cough and shortness of breath.   Cardiovascular: Negative for chest pain and palpitations.  Gastrointestinal: Negative for abdominal pain and vomiting.  Genitourinary: Negative for dysuria and hematuria.  Musculoskeletal: Negative for arthralgias and back pain.  Skin: Negative for color change and rash.  Neurological: Negative for seizures and syncope.  All other systems reviewed and are negative.   Physical Exam Updated Vital Signs BP 126/86   Pulse 86   Temp 98.5 F (36.9 C) (Oral)   Resp 18   Ht 5\' 6"  (1.676 m)   Wt 68.9 kg   SpO2 100%   BMI 24.53 kg/m   Physical Exam Vitals and nursing note reviewed.  Constitutional:      Appearance: He is well-developed.  HENT:     Head: Normocephalic and atraumatic.  Eyes:     Conjunctiva/sclera: Conjunctivae normal.     Pupils: Pupils are equal, round, and reactive to light.  Cardiovascular:     Rate and Rhythm: Normal rate and regular rhythm.  Pulses: Normal pulses.     Heart sounds: No murmur heard.   Pulmonary:     Effort: Pulmonary effort is normal. No respiratory distress.     Breath sounds: Normal breath sounds.  Abdominal:     General: Abdomen is flat.     Palpations: Abdomen is soft.     Tenderness: There is no abdominal tenderness.  Musculoskeletal:     Cervical back: Neck supple.  Skin:    General: Skin is warm and dry.     Capillary Refill: Capillary refill takes less than 2 seconds.  Neurological:     General: No focal deficit present.     Mental Status: He is alert and oriented to person, place, and time.     Cranial Nerves: No  cranial nerve deficit.     Sensory: No sensory deficit.     Motor: No weakness.     Coordination: Coordination normal.     ED Results / Procedures / Treatments   Labs (all labs ordered are listed, but only abnormal results are displayed) Labs Reviewed - No data to display  EKG None  Radiology No results found.  Procedures Procedures   Medications Ordered in ED Medications - No data to display  ED Course  I have reviewed the triage vital signs and the nursing notes.  Pertinent labs & imaging results that were available during my care of the patient were reviewed by me and considered in my medical decision making (see chart for details).    MDM Rules/Calculators/A&P                          Andrew Sellers is here with high blood pressure.  History of hypertension.  Blood pressure 126/86.  Tested positive for COVID 3 days ago.  Had GI symptoms that have now resolved.  Got a little bit lightheaded today while working in the yard.  Checked his blood pressure and was elevated in the 150s.  Feels much better now.  Feels like he might be a little bit dehydrated and I agree.  However clinically he appears fine.  Normal neurological exam.  No chest pain.  Overall suspect some mild dehydration and recommend aggressive oral repletion at home.  Given reassurance and discharged in ED in good condition.  This chart was dictated using voice recognition software.  Despite best efforts to proofread,  errors can occur which can change the documentation meaning.   Final Clinical Impression(s) / ED Diagnoses Final diagnoses:  COVID-19    Rx / DC Orders ED Discharge Orders    None       Virgina Norfolk, DO 07/23/20 1530

## 2020-07-23 NOTE — ED Notes (Signed)
Pt concerned about BP elevation at home. Upon arrival to ED BP within normal range.

## 2020-07-23 NOTE — ED Triage Notes (Signed)
Pt states he test + Covid on Wednesday. Today checked bp a couple of times and noted it to be high. In triage 134/96 (108) No other symptoms

## 2021-03-01 ENCOUNTER — Encounter (HOSPITAL_BASED_OUTPATIENT_CLINIC_OR_DEPARTMENT_OTHER): Payer: Self-pay

## 2021-03-01 ENCOUNTER — Emergency Department (HOSPITAL_BASED_OUTPATIENT_CLINIC_OR_DEPARTMENT_OTHER)
Admission: EM | Admit: 2021-03-01 | Discharge: 2021-03-01 | Disposition: A | Discharge status: Home / Self Care | Payer: BC Managed Care – PPO | Attending: Emergency Medicine | Admitting: Emergency Medicine

## 2021-03-01 ENCOUNTER — Other Ambulatory Visit (HOSPITAL_BASED_OUTPATIENT_CLINIC_OR_DEPARTMENT_OTHER): Payer: Self-pay

## 2021-03-01 ENCOUNTER — Other Ambulatory Visit: Payer: Self-pay

## 2021-03-01 DIAGNOSIS — Z20822 Contact with and (suspected) exposure to covid-19: Secondary | ICD-10-CM | POA: Diagnosis not present

## 2021-03-01 DIAGNOSIS — Z79899 Other long term (current) drug therapy: Secondary | ICD-10-CM | POA: Diagnosis not present

## 2021-03-01 DIAGNOSIS — I10 Essential (primary) hypertension: Secondary | ICD-10-CM | POA: Insufficient documentation

## 2021-03-01 DIAGNOSIS — R112 Nausea with vomiting, unspecified: Secondary | ICD-10-CM | POA: Diagnosis not present

## 2021-03-01 DIAGNOSIS — M791 Myalgia, unspecified site: Secondary | ICD-10-CM | POA: Diagnosis not present

## 2021-03-01 DIAGNOSIS — R519 Headache, unspecified: Secondary | ICD-10-CM | POA: Diagnosis not present

## 2021-03-01 LAB — CBC
HCT: 42.8 % (ref 39.0–52.0)
Hemoglobin: 15.6 g/dL (ref 13.0–17.0)
MCH: 31.1 pg (ref 26.0–34.0)
MCHC: 36.4 g/dL — ABNORMAL HIGH (ref 30.0–36.0)
MCV: 85.4 fL (ref 80.0–100.0)
Platelets: 300 10*3/uL (ref 150–400)
RBC: 5.01 MIL/uL (ref 4.22–5.81)
RDW: 12 % (ref 11.5–15.5)
WBC: 7.1 10*3/uL (ref 4.0–10.5)
nRBC: 0 % (ref 0.0–0.2)

## 2021-03-01 LAB — COMPREHENSIVE METABOLIC PANEL
ALT: 30 U/L (ref 0–44)
AST: 25 U/L (ref 15–41)
Albumin: 4.6 g/dL (ref 3.5–5.0)
Alkaline Phosphatase: 65 U/L (ref 38–126)
Anion gap: 10 (ref 5–15)
BUN: 8 mg/dL (ref 6–20)
CO2: 24 mmol/L (ref 22–32)
Calcium: 9.1 mg/dL (ref 8.9–10.3)
Chloride: 95 mmol/L — ABNORMAL LOW (ref 98–111)
Creatinine, Ser: 0.93 mg/dL (ref 0.61–1.24)
GFR, Estimated: >60 mL/min (ref >60)
Glucose, Bld: 99 mg/dL (ref 70–99)
Potassium: 4.2 mmol/L (ref 3.5–5.1)
Sodium: 129 mmol/L — ABNORMAL LOW (ref 135–145)
Total Bilirubin: 0.7 mg/dL (ref 0.3–1.2)
Total Protein: 7.9 g/dL (ref 6.5–8.1)

## 2021-03-01 LAB — LIPASE, BLOOD: Lipase: 30 U/L (ref 11–51)

## 2021-03-01 LAB — URINALYSIS, ROUTINE W REFLEX MICROSCOPIC
Bilirubin Urine: NEGATIVE
Glucose, UA: NEGATIVE mg/dL
Hgb urine dipstick: NEGATIVE
Ketones, ur: NEGATIVE mg/dL
Leukocytes,Ua: NEGATIVE
Nitrite: NEGATIVE
Protein, ur: NEGATIVE mg/dL
Specific Gravity, Urine: 1.01 (ref 1.005–1.030)
pH: 7 (ref 5.0–8.0)

## 2021-03-01 LAB — RESP PANEL BY RT-PCR (FLU A&B, COVID) ARPGX2
Influenza A by PCR: NEGATIVE
Influenza B by PCR: NEGATIVE
SARS Coronavirus 2 by RT PCR: NEGATIVE

## 2021-03-01 MED ORDER — ONDANSETRON 4 MG PO TBDP
4.0000 mg | ORAL_TABLET | Freq: Once | ORAL | Status: AC
  Administered 2021-03-01: 4 mg via ORAL
  Filled 2021-03-01: qty 1

## 2021-03-01 MED ORDER — ONDANSETRON 4 MG PO TBDP
4.0000 mg | ORAL_TABLET | Freq: Three times a day (TID) | ORAL | 1 refills | Status: DC | PRN
  Filled 2021-03-01: qty 12, 10d supply, fill #0

## 2021-03-01 NOTE — ED Provider Notes (Signed)
[facility] EMERGENCY DEPARTMENT Provider Note   CSN: [id number] Arrival date & time: 03/01/21  1159     History  Chief Complaint  Patient presents with   Vomiting    Andrew Sellers is a 31 y.o. male.  Patient with acute onset of nausea vomiting no diarrhea that occurred overnight.  Vomited about 4 times.  Associated with headache and some leg pain sort of myalgias.  Patient recently started Lexapro.  He is wondering if side effects from that could be the cause.  It is possible.  Patient without any abdominal pain no fevers no upper respiratory symptoms.  Past medical history is significant for hypertension.  And blood pressure here is very good at 126/81.  Temp is 98.2 heart rate is 65 respirations 16 oxygen sats 100%.  Patient has not vomited now for several hours.  No blood in the vomit.  Again no diarrhea no abdominal pain.      Home Medications Prior to Admission medications   Medication Sig Start Date End Date Taking? Authorizing Provider  ondansetron (ZOFRAN-ODT) 4 MG disintegrating tablet Take 1 tablet (4 mg total) by mouth every 8 (eight) hours as needed for nausea or vomiting. 03/01/21  Yes Fredia Sorrow, MD  ciprofloxacin (CIPRO) 500 MG tablet Take 1 tablet (500 mg total) by mouth 2 (two) times daily. 10/27/16   Blanchie Dessert, MD  ibuprofen (ADVIL) 200 MG tablet 2-3 tablets every 6 hours for pain as needed.  You can also use plain tylenol.  Save narcotic pain med for pain not relieved by tylenol or ibuprofen. 07/06/11   Earnstine Regal, PA-C  olmesartan (BENICAR) 40 MG tablet Take 20 mg by mouth 2 (two) times daily.    [provider]      Allergies    Ceclor [cefaclor]    Review of Systems   Review of Systems  Constitutional:  Negative for chills and fever.  HENT:  Negative for ear pain and sore throat.   Eyes:  Negative for pain and visual disturbance.  Respiratory:  Negative for cough and shortness of breath.   Cardiovascular:  Negative for  chest pain and palpitations.  Gastrointestinal:  Positive for nausea and vomiting. Negative for abdominal pain and diarrhea.  Genitourinary:  Negative for dysuria and hematuria.  Musculoskeletal:  Negative for arthralgias and back pain.  Skin:  Negative for color change and rash.  Neurological:  Positive for headaches. Negative for seizures and syncope.  All other systems reviewed and are negative.  Physical Exam Updated Vital Signs BP 118/86    Pulse 65    Temp 98.2 F (36.8 C) (Oral)    Resp 15    Ht 1.676 m (5\' 6" )    Wt 69.4 kg    SpO2 100%    BMI 24.69 kg/m  Physical Exam Vitals and nursing note reviewed.  Constitutional:      General: He is not in acute distress.    Appearance: Normal appearance. He is well-developed.  HENT:     Head: Normocephalic and atraumatic.     Mouth/Throat:     Mouth: Mucous membranes are moist.  Eyes:     Extraocular Movements: Extraocular movements intact.     Conjunctiva/sclera: Conjunctivae normal.     Pupils: Pupils are equal, round, and reactive to light.  Cardiovascular:     Rate and Rhythm: Normal rate and regular rhythm.     Heart sounds: No murmur heard. Pulmonary:     Effort: Pulmonary effort is normal.  No respiratory distress.     Breath sounds: Normal breath sounds.  Abdominal:     Palpations: Abdomen is soft.     Tenderness: There is no abdominal tenderness.  Musculoskeletal:        General: No swelling. Normal range of motion.     Cervical back: Normal range of motion and neck supple.  Skin:    General: Skin is warm and dry.     Capillary Refill: Capillary refill takes less than 2 seconds.  Neurological:     General: No focal deficit present.     Mental Status: He is alert and oriented to person, place, and time.     Cranial Nerves: No cranial nerve deficit.     Sensory: No sensory deficit.     Motor: No weakness.  Psychiatric:        Mood and Affect: Mood normal.    ED Results / Procedures / Treatments   Labs (all  labs ordered are listed, but only abnormal results are displayed) Labs Reviewed  COMPREHENSIVE METABOLIC PANEL - Abnormal; Notable for the following components:      Result Value   Sodium 129 (*)    Chloride 95 (*)    All other components within normal limits  CBC - Abnormal; Notable for the following components:   MCHC 36.4 (*)    All other components within normal limits  RESP PANEL BY RT-PCR (FLU A&B, COVID) ARPGX2  LIPASE, BLOOD  URINALYSIS, ROUTINE W REFLEX MICROSCOPIC    EKG None  Radiology No results found.  Procedures Procedures    Medications Ordered in ED Medications  ondansetron (ZOFRAN-ODT) disintegrating tablet 4 mg (4 mg Oral Given 03/01/21 1604)    ED Course/ Medical Decision Making/ A&P                           Medical Decision Making Patient's electrolytes significant for sodium of 129.  However renal functions normal.  Potassium is normal.  Patient has been drinking Gatorade well throughout the day.  Has not vomited for several hours.  Do not feel he needs IV hydration.  Very possible based on his symptoms that he could meet the side effect profile of Lexapro.  Certainly does not seem to fit a viral illness COVID and flu was negative White blood cell count normal at 7.1.  Patient given some ODT Zofran here.  Patient feels better.  Will discharge home with ODT Zofran.  We will ask him to hold the Lexapro for a week.  And then can restart and if symptoms recur then he has his answer.  Patient nontoxic no acute distress  Final Clinical Impression(s) / ED Diagnoses Final diagnoses:  Nausea and vomiting, unspecified vomiting type    Rx / DC Orders ED Discharge Orders          Ordered    ondansetron (ZOFRAN-ODT) 4 MG disintegrating tablet  Every 8 hours PRN        03/01/21 1653              Fredia Sorrow, MD 03/01/21 1658

## 2021-03-01 NOTE — ED Triage Notes (Signed)
Pt c/o n/v, HA, leg pain-sx started yesterday-NAD-steady gait

## 2021-03-01 NOTE — Discharge Instructions (Addendum)
I would recommend holding the Lexapro for at least a week.  Take Zofran as needed for nausea and vomiting.  Hydrate yourself well with Gatorade.  If vomiting persist he will need to get reevaluated tomorrow because dehydration could be setting in.  Today's labs without significant abnormalities other than that your sodium was a little low at 129.  Kidney function was normal.  Very possible that your symptoms are caused from the Lexapro.  Has a very broad side effect profile.

## 2021-05-12 ENCOUNTER — Telehealth: Payer: BC Managed Care – PPO | Admitting: Physician Assistant

## 2021-05-12 DIAGNOSIS — J329 Chronic sinusitis, unspecified: Secondary | ICD-10-CM | POA: Diagnosis not present

## 2021-05-12 MED ORDER — AMOXICILLIN-POT CLAVULANATE 875-125 MG PO TABS: 1.0000 | ORAL_TABLET | Freq: Two times a day (BID) | ORAL | 0 refills | Status: AC

## 2021-05-12 NOTE — Progress Notes (Signed)

## 2021-06-30 DIAGNOSIS — M25512 Pain in left shoulder: Secondary | ICD-10-CM | POA: Diagnosis not present

## 2021-07-04 DIAGNOSIS — M25512 Pain in left shoulder: Secondary | ICD-10-CM | POA: Diagnosis not present

## 2021-07-11 DIAGNOSIS — M25512 Pain in left shoulder: Secondary | ICD-10-CM | POA: Diagnosis not present

## 2021-08-25 DIAGNOSIS — M25512 Pain in left shoulder: Secondary | ICD-10-CM | POA: Diagnosis not present

## 2021-08-30 DIAGNOSIS — I10 Essential (primary) hypertension: Secondary | ICD-10-CM | POA: Diagnosis not present

## 2021-09-04 DIAGNOSIS — Z1339 Encounter for screening examination for other mental health and behavioral disorders: Secondary | ICD-10-CM | POA: Diagnosis not present

## 2021-09-04 DIAGNOSIS — Z0000 Encounter for general adult medical examination without abnormal findings: Secondary | ICD-10-CM | POA: Diagnosis not present

## 2021-09-04 DIAGNOSIS — I10 Essential (primary) hypertension: Secondary | ICD-10-CM | POA: Diagnosis not present

## 2021-09-04 DIAGNOSIS — Z1331 Encounter for screening for depression: Secondary | ICD-10-CM | POA: Diagnosis not present

## 2021-09-06 DIAGNOSIS — M25512 Pain in left shoulder: Secondary | ICD-10-CM | POA: Diagnosis not present

## 2021-09-08 DIAGNOSIS — M25512 Pain in left shoulder: Secondary | ICD-10-CM | POA: Diagnosis not present

## 2021-12-06 DIAGNOSIS — I10 Essential (primary) hypertension: Secondary | ICD-10-CM | POA: Diagnosis not present

## 2022-03-05 DIAGNOSIS — I10 Essential (primary) hypertension: Secondary | ICD-10-CM | POA: Diagnosis not present

## 2022-06-12 ENCOUNTER — Telehealth: Payer: BC Managed Care – PPO | Admitting: Physician Assistant

## 2022-06-12 DIAGNOSIS — B9689 Other specified bacterial agents as the cause of diseases classified elsewhere: Secondary | ICD-10-CM

## 2022-06-12 DIAGNOSIS — J019 Acute sinusitis, unspecified: Secondary | ICD-10-CM

## 2022-06-12 MED ORDER — AMOXICILLIN-POT CLAVULANATE 875-125 MG PO TABS: 1.0000 | ORAL_TABLET | Freq: Two times a day (BID) | ORAL | 0 refills | Status: DC

## 2022-06-12 NOTE — Progress Notes (Signed)

## 2023-01-08 DIAGNOSIS — I10 Essential (primary) hypertension: Secondary | ICD-10-CM | POA: Diagnosis not present

## 2023-01-15 DIAGNOSIS — Z0000 Encounter for general adult medical examination without abnormal findings: Secondary | ICD-10-CM | POA: Diagnosis not present

## 2023-01-15 DIAGNOSIS — Z1339 Encounter for screening examination for other mental health and behavioral disorders: Secondary | ICD-10-CM | POA: Diagnosis not present

## 2023-01-15 DIAGNOSIS — R82998 Other abnormal findings in urine: Secondary | ICD-10-CM | POA: Diagnosis not present

## 2023-01-15 DIAGNOSIS — Z23 Encounter for immunization: Secondary | ICD-10-CM | POA: Diagnosis not present

## 2023-01-15 DIAGNOSIS — Z1331 Encounter for screening for depression: Secondary | ICD-10-CM | POA: Diagnosis not present

## 2023-01-15 DIAGNOSIS — I10 Essential (primary) hypertension: Secondary | ICD-10-CM | POA: Diagnosis not present

## 2023-06-11 ENCOUNTER — Telehealth: Admitting: Physician Assistant

## 2023-06-11 DIAGNOSIS — B9789 Other viral agents as the cause of diseases classified elsewhere: Secondary | ICD-10-CM | POA: Diagnosis not present

## 2023-06-11 DIAGNOSIS — J019 Acute sinusitis, unspecified: Secondary | ICD-10-CM | POA: Diagnosis not present

## 2023-06-11 MED ORDER — IPRATROPIUM BROMIDE 0.03 % NA SOLN: 2.0000 | Freq: Two times a day (BID) | NASAL | 0 refills | Status: AC

## 2023-06-11 NOTE — Progress Notes (Signed)
 I have spent 5 minutes in review of e-visit questionnaire, review and updating patient chart, medical decision making and response to patient.   Piedad Climes, PA-C

## 2023-06-11 NOTE — Progress Notes (Signed)

## 2023-06-25 ENCOUNTER — Telehealth: Admitting: Physician Assistant

## 2023-06-25 DIAGNOSIS — J019 Acute sinusitis, unspecified: Secondary | ICD-10-CM | POA: Diagnosis not present

## 2023-06-25 DIAGNOSIS — B9689 Other specified bacterial agents as the cause of diseases classified elsewhere: Secondary | ICD-10-CM | POA: Diagnosis not present

## 2023-06-25 MED ORDER — DOXYCYCLINE HYCLATE 100 MG PO TABS: 100.0000 mg | ORAL_TABLET | Freq: Two times a day (BID) | ORAL | 0 refills | Status: AC

## 2023-06-25 NOTE — Progress Notes (Signed)
 I have spent 5 minutes in review of e-visit questionnaire, review and updating patient chart, medical decision making and response to patient.   Piedad Climes, PA-C

## 2023-06-25 NOTE — Progress Notes (Signed)
